# Patient Record
Sex: Female | Born: 1970 | Race: Black or African American | Hispanic: No | Marital: Single | Smoking: Never smoker
Health system: Southern US, Community
[De-identification: ages and names within clinical notes are randomized; demographics above are authoritative.]

## PROBLEM LIST (undated history)

## (undated) DIAGNOSIS — M1990 Unspecified osteoarthritis, unspecified site: Secondary | ICD-10-CM

## (undated) DIAGNOSIS — D259 Leiomyoma of uterus, unspecified: Secondary | ICD-10-CM

## (undated) DIAGNOSIS — D649 Anemia, unspecified: Secondary | ICD-10-CM

## (undated) DIAGNOSIS — I10 Essential (primary) hypertension: Secondary | ICD-10-CM

## (undated) DIAGNOSIS — N939 Abnormal uterine and vaginal bleeding, unspecified: Secondary | ICD-10-CM

## (undated) HISTORY — DX: Essential (primary) hypertension: I10

## (undated) HISTORY — PX: TUBAL LIGATION: SHX77

---

## 1998-02-04 HISTORY — PX: TUBAL LIGATION: SHX77

## 2015-05-25 ENCOUNTER — Ambulatory Visit (INDEPENDENT_AMBULATORY_CARE_PROVIDER_SITE_OTHER): Payer: Federal, State, Local not specified - PPO | Admitting: Physician Assistant

## 2015-05-25 ENCOUNTER — Ambulatory Visit (HOSPITAL_COMMUNITY)
Admission: RE | Admit: 2015-05-25 | Discharge: 2015-05-25 | Disposition: A | Discharge status: Home / Self Care | Payer: Federal, State, Local not specified - PPO | Source: Ambulatory Visit | Attending: Physician Assistant | Admitting: Physician Assistant

## 2015-05-25 ENCOUNTER — Ambulatory Visit (INDEPENDENT_AMBULATORY_CARE_PROVIDER_SITE_OTHER): Payer: Federal, State, Local not specified - PPO

## 2015-05-25 ENCOUNTER — Other Ambulatory Visit: Payer: Self-pay | Admitting: Physician Assistant

## 2015-05-25 VITALS — BP 162/100 | HR 76 | Temp 98.5°F | Ht 70.0 in | Wt 329.0 lb

## 2015-05-25 DIAGNOSIS — R030 Elevated blood-pressure reading, without diagnosis of hypertension: Secondary | ICD-10-CM | POA: Insufficient documentation

## 2015-05-25 DIAGNOSIS — R0609 Other forms of dyspnea: Secondary | ICD-10-CM

## 2015-05-25 DIAGNOSIS — R51 Headache: Secondary | ICD-10-CM

## 2015-05-25 DIAGNOSIS — R42 Dizziness and giddiness: Secondary | ICD-10-CM | POA: Insufficient documentation

## 2015-05-25 DIAGNOSIS — IMO0001 Reserved for inherently not codable concepts without codable children: Secondary | ICD-10-CM

## 2015-05-25 DIAGNOSIS — R519 Headache, unspecified: Secondary | ICD-10-CM

## 2015-05-25 DIAGNOSIS — R0600 Dyspnea, unspecified: Secondary | ICD-10-CM

## 2015-05-25 LAB — COMPLETE METABOLIC PANEL WITH GFR
ALBUMIN: 3.7 g/dL (ref 3.6–5.1)
ALT: 10 U/L (ref 6–29)
AST: 16 U/L (ref 10–35)
Alkaline Phosphatase: 93 U/L (ref 33–115)
BUN: 9 mg/dL (ref 7–25)
CALCIUM: 8.8 mg/dL (ref 8.6–10.2)
CO2: 25 mmol/L (ref 20–31)
CREATININE: 0.59 mg/dL (ref 0.50–1.10)
Chloride: 101 mmol/L (ref 98–110)
GFR, Est Non African American: >89 mL/min (ref >=60)
Glucose, Bld: 81 mg/dL (ref 65–99)
POTASSIUM: 3.9 mmol/L (ref 3.5–5.3)
Sodium: 135 mmol/L (ref 135–146)
Total Bilirubin: 0.3 mg/dL (ref 0.2–1.2)
Total Protein: 7.8 g/dL (ref 6.1–8.1)

## 2015-05-25 LAB — POCT CBC
GRANULOCYTE PERCENT: 67.7 % (ref 37–80)
HEMATOCRIT: 30.7 % — AB (ref 37.7–47.9)
Hemoglobin: 10.3 g/dL — AB (ref 12.2–16.2)
LYMPH, POC: 3 (ref 0.6–3.4)
MCH: 27.9 pg (ref 27–31.2)
MCHC: 33.6 g/dL (ref 31.8–35.4)
MCV: 83.1 fL (ref 80–97)
MID (cbc): 0.8 (ref 0–0.9)
MPV: 7 fL (ref 0–99.8)
POC GRANULOCYTE: 7.9 — AB (ref 2–6.9)
POC LYMPH PERCENT: 25.8 %L (ref 10–50)
POC MID %: 6.5 % (ref 0–12)
Platelet Count, POC: 395 10*3/uL (ref 142–424)
RBC: 3.69 M/uL — AB (ref 4.04–5.48)
RDW, POC: 14.8 %
WBC: 11.7 10*3/uL — AB (ref 4.6–10.2)

## 2015-05-25 LAB — POCT URINALYSIS DIP (MANUAL ENTRY)
BILIRUBIN UA: NEGATIVE
BILIRUBIN UA: NEGATIVE
Blood, UA: NEGATIVE
Glucose, UA: NEGATIVE
LEUKOCYTES UA: NEGATIVE
Nitrite, UA: NEGATIVE
PH UA: 6.5
Protein Ur, POC: NEGATIVE
SPEC GRAV UA: 1.01
Urobilinogen, UA: 0.2

## 2015-05-25 LAB — GLUCOSE, POCT (MANUAL RESULT ENTRY): POC Glucose: 88 mg/dl (ref 70–99)

## 2015-05-25 MED ORDER — IBUPROFEN 600 MG PO TABS: 600.0000 mg | ORAL_TABLET | Freq: Three times a day (TID) | ORAL | Status: DC | PRN

## 2015-05-25 MED ORDER — MECLIZINE HCL 25 MG PO TABS: 25.0000 mg | ORAL_TABLET | Freq: Three times a day (TID) | ORAL | Status: DC | PRN

## 2015-05-25 MED ORDER — LISINOPRIL-HYDROCHLOROTHIAZIDE 20-25 MG PO TABS: 0.5000 | ORAL_TABLET | Freq: Every day | ORAL | Status: DC

## 2015-05-25 NOTE — Progress Notes (Deleted)
   05/25/2015 12:27 PM   DOB: 04-20-1970 / MRN: [id number]  SUBJECTIVE:  Bethany Conley is a 45 y.o. female presenting for   She has no allergies on file.   She  has no past medical history on file.    She   She  has no sexual activity history on file. The patient  has no past surgical history on file.  Her family history is not on file.  ROS  Problem list and medications reviewed and updated by myself where necessary, and exist elsewhere in the encounter.   OBJECTIVE:  There were no vitals taken for this visit.  Physical Exam  No results found for this or any previous visit (from the past 72 hour(s)).  No results found.  ASSESSMENT AND PLAN  There are no diagnoses linked to this encounter.  The patient was advised to call or return to clinic if she does not see an improvement in symptoms or to seek the care of the closest emergency department if she worsens with the above plan.   Deliah BostonMichael Clark, MHS, PA-C [facility] 05/25/2015 12:27 PM

## 2015-05-25 NOTE — Progress Notes (Signed)
05/26/2015 8:34 AM   DOB: 1970-03-07 / MRN: [id number]  SUBJECTIVE:  Bethany Conley is a 45 y.o. female presenting for dizziness and HA.  Reports the dizziness is a room spinning type dizziness and may last up to thirty minutes before subsiding. Associates neck pain that is worse with head movement.  She has tried Excedrin with good relief of both dizziness and HA.   She is hypertensive today and has never had this problem before. Also complains of some new DOE with stair climbing. She denies chest pain, arm pain, diaphoresis.  She is a never smoker.  Lipids have never been evaluated.   She does report a history of vertigo and was treated with meclizine by her provider in [place].  Reports that her dizziness is similar to that today but also different.  She does complain that the dizziness if worse with head movement.       She is allergic to sulfa antibiotics.  She  has no past medical history on file.    She  reports that she has never smoked. She has never used smokeless tobacco. She  has no sexual activity history on file. The patient  has no past surgical history on file.  Her family history is not on file.  Review of Systems  Constitutional: Negative for fever and chills.  Respiratory: Negative for cough.   Cardiovascular: Negative for chest pain.  Gastrointestinal: Negative for nausea, vomiting and abdominal pain.  Skin: Negative for rash.  Neurological: Negative for dizziness and headaches.    Problem list and medications reviewed and updated by myself where necessary, and exist elsewhere in the encounter.   OBJECTIVE:  BP 162/100 mmHg  Pulse 76  Temp(Src) 98.5 F (36.9 C) (Oral)  Ht 5' 10"  (1.778 m)  Wt 329 lb (149.233 kg)  BMI 47.21 kg/m2  SpO2 98%  LMP 05/12/2015  Physical Exam  Constitutional: She is oriented to person, place, and time. She appears well-nourished. No distress.  Eyes: EOM are normal. Pupils are equal, round, and reactive to light.    Cardiovascular: Normal rate, regular rhythm, normal heart sounds and intact distal pulses.   Pulmonary/Chest: Effort normal and breath sounds normal.  Abdominal: She exhibits no distension.  Neurological: She is alert and oriented to person, place, and time. No cranial nerve deficit. Gait normal.  Skin: Skin is dry. She is not diaphoretic.  Psychiatric: She has a normal mood and affect.  Vitals reviewed.   Results for orders placed or performed in visit on 05/25/15 (from the past 72 hour(s))  POCT CBC     Status: Abnormal   Collection Time: 05/25/15  1:33 PM  Result Value Ref Range   WBC 11.7 (A) 4.6 - 10.2 K/uL   Lymph, poc 3.0 0.6 - 3.4   POC LYMPH PERCENT 25.8 10 - 50 %L   MID (cbc) 0.8 0 - 0.9   POC MID % 6.5 0 - 12 %M   POC Granulocyte 7.9 (A) 2 - 6.9   Granulocyte percent 67.7 37 - 80 %G   RBC 3.69 (A) 4.04 - 5.48 M/uL   Hemoglobin 10.3 (A) 12.2 - 16.2 g/dL   HCT, POC 30.7 (A) 37.7 - 47.9 %   MCV 83.1 80 - 97 fL   MCH, POC 27.9 27 - 31.2 pg   MCHC 33.6 31.8 - 35.4 g/dL   RDW, POC 14.8 %   Platelet Count, POC 395 142 - 424 K/uL   MPV 7.0 0 - 99.8  fL  POCT urinalysis dipstick     Status: Abnormal   Collection Time: 05/25/15  1:34 PM  Result Value Ref Range   Color, UA yellow yellow   Clarity, UA cloudy (A) clear   Glucose, UA negative negative   Bilirubin, UA negative negative   Ketones, POC UA negative negative   Spec Grav, UA 1.010    Blood, UA negative negative   pH, UA 6.5    Protein Ur, POC negative negative   Urobilinogen, UA 0.2    Nitrite, UA Negative Negative   Leukocytes, UA Negative Negative  COMPLETE METABOLIC PANEL WITH GFR     Status: None   Collection Time: 05/25/15  2:24 PM  Result Value Ref Range   Sodium 135 135 - 146 mmol/L   Potassium 3.9 3.5 - 5.3 mmol/L   Chloride 101 98 - 110 mmol/L   CO2 25 20 - 31 mmol/L   Glucose, Bld 81 65 - 99 mg/dL   BUN 9 7 - 25 mg/dL   Creat 0.59 0.50 - 1.10 mg/dL   Total Bilirubin 0.3 0.2 - 1.2 mg/dL    Alkaline Phosphatase 93 33 - 115 U/L   AST 16 10 - 35 U/L   ALT 10 6 - 29 U/L   Total Protein 7.8 6.1 - 8.1 g/dL   Albumin 3.7 3.6 - 5.1 g/dL   Calcium 8.8 8.6 - 10.2 mg/dL   GFR, Est African American >89 >=60 mL/min   GFR, Est Non African American >89 >=60 mL/min    Comment:   The estimated GFR is a calculation valid for adults (>=52 years old) that uses the CKD-EPI algorithm to adjust for age and sex. It is   not to be used for children, pregnant women, hospitalized patients,    patients on dialysis, or with rapidly changing kidney function. According to the NKDEP, eGFR >89 is normal, 60-89 shows mild impairment, 30-59 shows moderate impairment, 15-29 shows severe impairment and <15 is ESRD.     POCT glucose (manual entry)     Status: None   Collection Time: 05/25/15  2:28 PM  Result Value Ref Range   POC Glucose 88 70 - 99 mg/dl    Dg Chest 2 View  05/25/2015  CLINICAL DATA:  Dizziness, headache and elevated blood pressure. EXAM: CHEST  2 VIEW COMPARISON:  None. FINDINGS: The heart size and mediastinal contours are within normal limits. Both lungs are clear. The visualized skeletal structures are unremarkable. IMPRESSION: Normal chest x-ray. Electronically Signed   By: Marijo Sanes M.D.   On: 05/25/2015 13:50   Ct Head Wo Contrast  05/25/2015  CLINICAL DATA:  Dizziness and headaches over the past few weeks. EXAM: CT HEAD WITHOUT CONTRAST TECHNIQUE: Contiguous axial images were obtained from the base of the skull through the vertex without intravenous contrast. COMPARISON:  None. FINDINGS: The ventricles are normal in size and configuration. No extra-axial fluid collections are identified. The gray-white differentiation is normal. No CT findings for acute intracranial process such as hemorrhage or infarction. No mass lesions. The brainstem and cerebellum are grossly normal. Benign appearing bilateral basilar artery calcifications. The bony structures are intact. The paranasal sinuses  and mastoid air cells are clear. The globes are intact. IMPRESSION: Normal head CT. Electronically Signed   By: Marijo Sanes M.D.   On: 05/25/2015 15:15    ASSESSMENT AND PLAN  Bethany Conley was seen today for dizziness, headache, elevated bp at triage and neck pain.  Diagnoses and all orders for  this visit:  Acute nonintractable headache, unspecified headache type: Her EKG is normal. Her pressure is not quite high enough to cause HA and dizziness, however she has been taking Exedrine to relieve the HA thus making an intracranial process possible.  Given her leukocytosis she may have an ABRS.  Will get a head CT to evaluate for intracranial process and to rule in or out ABRS.  Will treat per head ct and will plan to start her on HCTZ-Lisinopril today either way.   Elevated BP: See problem one.  -     EKG 12-Lead -     POCT CBC -     POCT urinalysis dipstick -     DG Chest 2 View; Future -     CT Head Wo Contrast; Future  DOE: Again, her EKG is normal and she has a normal chest rad.  She denies new leg swelling and chest pain.  Will get her into cardiology next week. Will evaluate lipids to calculate ASCVD score.  CMET negative for elevated blood glucose.      The patient was advised to call or return to clinic if she does not see an improvement in symptoms or to seek the care of the closest emergency department if she worsens with the above plan.   Philis Fendt, MHS, PA-C [facility] 05/26/2015 8:34 AM

## 2015-05-25 NOTE — Patient Instructions (Addendum)
[facility] can go straight to [name] to [facility]. You will go to Radiology on the first floor and register there.      IF you received an x-ray today, you will receive an invoice from [facility]. Please contact [facility] at [phone] with questions or concerns regarding your invoice.   IF you received labwork today, you will receive an invoice from [facility]. Please contact [facility] at [phone] with questions or concerns regarding your invoice.   Our billing staff will not be able to assist you with questions regarding bills from these companies.  You will be contacted with the lab results as soon as they are available. The fastest way to get your results is to activate your My Chart account. Instructions are located on the last page of this paperwork. If you have not heard from us regarding the results in 2 weeks, please contact this office.

## 2015-05-29 LAB — LIPID PANEL
CHOL/HDL RATIO: 2.9 ratio (ref <=5.0)
CHOLESTEROL: 199 mg/dL (ref 125–200)
HDL: 69 mg/dL (ref >=46)
LDL CALC: 117 mg/dL (ref <130)
TRIGLYCERIDES: 63 mg/dL (ref <150)
VLDL: 13 mg/dL (ref <30)

## 2015-08-30 ENCOUNTER — Other Ambulatory Visit: Payer: Self-pay | Admitting: Family Medicine

## 2015-08-30 DIAGNOSIS — Z1231 Encounter for screening mammogram for malignant neoplasm of breast: Secondary | ICD-10-CM

## 2015-09-06 ENCOUNTER — Ambulatory Visit: Payer: Federal, State, Local not specified - PPO

## 2015-09-13 ENCOUNTER — Ambulatory Visit
Admission: RE | Admit: 2015-09-13 | Discharge: 2015-09-13 | Disposition: A | Discharge status: Home / Self Care | Payer: Federal, State, Local not specified - PPO | Source: Ambulatory Visit | Attending: Family Medicine | Admitting: Family Medicine

## 2015-09-13 ENCOUNTER — Ambulatory Visit: Payer: Federal, State, Local not specified - PPO

## 2015-09-13 DIAGNOSIS — Z1231 Encounter for screening mammogram for malignant neoplasm of breast: Secondary | ICD-10-CM

## 2016-03-02 ENCOUNTER — Ambulatory Visit (HOSPITAL_COMMUNITY)
Admission: EM | Admit: 2016-03-02 | Discharge: 2016-03-02 | Disposition: A | Discharge status: Home / Self Care | Payer: 59 | Attending: Family Medicine | Admitting: Family Medicine

## 2016-03-02 ENCOUNTER — Ambulatory Visit (INDEPENDENT_AMBULATORY_CARE_PROVIDER_SITE_OTHER): Payer: 59

## 2016-03-02 ENCOUNTER — Encounter (HOSPITAL_COMMUNITY): Payer: Self-pay | Admitting: Family Medicine

## 2016-03-02 DIAGNOSIS — M1712 Unilateral primary osteoarthritis, left knee: Secondary | ICD-10-CM | POA: Diagnosis not present

## 2016-03-02 MED ORDER — KETOROLAC TROMETHAMINE 60 MG/2ML IM SOLN
INTRAMUSCULAR | Status: AC
  Filled 2016-03-02: qty 2

## 2016-03-02 MED ORDER — KETOROLAC TROMETHAMINE 60 MG/2ML IM SOLN
60.0000 mg | Freq: Once | INTRAMUSCULAR | Status: AC
  Administered 2016-03-02: 60 mg via INTRAMUSCULAR

## 2016-03-02 MED ORDER — PREDNISONE 10 MG PO TABS: 20.0000 mg | ORAL_TABLET | Freq: Every day | ORAL | 0 refills | Status: AC

## 2016-03-02 MED ORDER — IBUPROFEN 800 MG PO TABS: 800.0000 mg | ORAL_TABLET | Freq: Three times a day (TID) | ORAL | 0 refills | Status: AC

## 2016-03-02 NOTE — ED Triage Notes (Signed)
Pt here for left knee pain, redness and swelling. sts has been using ice and motrin without relief.denies injury.

## 2016-03-02 NOTE — ED Provider Notes (Signed)
CSN: [id number]     Arrival date & time 03/02/16  1928 History   First MD Initiated Contact with Patient 03/02/16 2059     Chief Complaint  Patient presents with  . Knee Pain   (Consider location/radiation/quality/duration/timing/severity/associated sxs/prior Treatment) Cannot bear weight on the left leg.    The history is provided by the patient.  Knee Pain  Location:  Knee Time since incident:  2 days Injury: no   Knee location:  L knee Pain details:    Quality:  Throbbing and shooting   Radiates to:  Does not radiate   Pain severity now: 10/10.   Onset quality:  Gradual   Duration:  2 days   Timing:  Constant   Progression:  Worsening Chronicity:  New Dislocation: no   Prior injury to area:  No Worsened by:  Nothing Ineffective treatments:  NSAIDs and acetaminophen Associated symptoms: stiffness and swelling   Associated symptoms: no back pain, no decreased ROM, no fever, no numbness and no tingling   Risk factors: no recent illness     History reviewed. No pertinent past medical history. History reviewed. No pertinent surgical history. History reviewed. No pertinent family history. Social History  Substance Use Topics  . Smoking status: Never Smoker  . Smokeless tobacco: Never Used  . Alcohol use Not on file   OB History    No data available     Review of Systems  Constitutional: Negative for fever.       As stated in the HPI  Musculoskeletal: Positive for stiffness. Negative for back pain.    Allergies  Sulfa antibiotics  Home Medications   Prior to Admission medications   Medication Sig Start Date End Date Taking? Authorizing Provider  ibuprofen (ADVIL,MOTRIN) 800 MG tablet Take 1 tablet (800 mg total) by mouth 3 (three) times daily. 03/02/16 03/07/16  Lucia Estelle, NP  lisinopril-hydrochlorothiazide (PRINZIDE,ZESTORETIC) 20-25 MG tablet Take 0.5 tablets by mouth daily. 05/25/15   Ofilia Neas, PA-C  meclizine (ANTIVERT) 25 MG tablet Take 1 tablet (25  mg total) by mouth 3 (three) times daily as needed for dizziness. 05/25/15   Ofilia Neas, PA-C  predniSONE (DELTASONE) 10 MG tablet Take 2 tablets (20 mg total) by mouth daily. Take 60 mg on day 1-2, take 40 mg on day 3-4, take 20 mg on day 5-6. 03/02/16 03/14/16  Lucia Estelle, NP   Meds Ordered and Administered this Visit   Medications  ketorolac (TORADOL) injection 60 mg (not administered)    BP 162/94   Pulse 90   Temp 98.5 F (36.9 C)   Resp 18   SpO2 100%  No data found.   Physical Exam  Constitutional: She is oriented to person, place, and time. She appears well-developed and well-nourished.  HENT:  Head: Normocephalic.  Cardiovascular: Normal rate.   Pulmonary/Chest: Effort normal.  Musculoskeletal:  Left knee has swelling and redness. No pain on palpation. Has full ROM.  Crepitus present. Patient in wheelchair.   Right knee unremarkable.   Neurological: She is alert and oriented to person, place, and time.    Urgent Care Course     Procedures (including critical care time)  Labs Review Labs Reviewed - No data to display  Imaging Review Dg Knee Complete 4 Views Left  Result Date: 03/02/2016 CLINICAL DATA:  46 year old female with left knee pain and swelling. No trauma. EXAM: LEFT KNEE - COMPLETE 4+ VIEW COMPARISON:  None. FINDINGS: There is no acute fracture or dislocation. There  is mild osteoarthritic changes with mild narrowing of the medial compartment and bone spurring. There is a small suprapatellar effusion. The soft tissues appear unremarkable. IMPRESSION: No acute fracture or dislocation. Mild osteoarthritic changes. Small suprapatellar effusion. Electronically Signed   By: Elgie CollardArash  Radparvar M.D.   On: 03/02/2016 21:17    MDM   1. Primary osteoarthritis of left knee    1) Crepitus present on exam. Xray reveals mild osteoarthritic changes 2) Start ibuprofen PRN for pain relief 3) Start prednisone 6-day taper 4) Toradol 60 mg IM given 5) F/u with PCP  with further management.  6) All questions answered.    Lucia EstelleFeng Evaan Tidwell, NP 03/02/16 2129

## 2016-04-04 ENCOUNTER — Telehealth: Payer: Self-pay | Admitting: Emergency Medicine

## 2016-04-04 NOTE — Telephone Encounter (Signed)
Patient called to return call. Phone was disconnected. Please call patient back.

## 2016-04-04 NOTE — Telephone Encounter (Signed)
Called and left voicemail for pt to return call to office.  

## 2016-04-04 NOTE — Telephone Encounter (Signed)
Pre visit attempted. Left voicemail for pt to return call to office.  

## 2016-04-10 ENCOUNTER — Encounter: Payer: Self-pay | Admitting: Family Medicine

## 2016-04-10 ENCOUNTER — Ambulatory Visit (INDEPENDENT_AMBULATORY_CARE_PROVIDER_SITE_OTHER): Payer: 59 | Admitting: Family Medicine

## 2016-04-10 VITALS — BP 124/82 | HR 87 | Temp 98.3°F | Ht 69.5 in | Wt 345.4 lb

## 2016-04-10 DIAGNOSIS — D500 Iron deficiency anemia secondary to blood loss (chronic): Secondary | ICD-10-CM | POA: Diagnosis not present

## 2016-04-10 DIAGNOSIS — N920 Excessive and frequent menstruation with regular cycle: Secondary | ICD-10-CM | POA: Diagnosis not present

## 2016-04-10 DIAGNOSIS — M25562 Pain in left knee: Secondary | ICD-10-CM | POA: Insufficient documentation

## 2016-04-10 DIAGNOSIS — R600 Localized edema: Secondary | ICD-10-CM

## 2016-04-10 DIAGNOSIS — M25572 Pain in left ankle and joints of left foot: Secondary | ICD-10-CM | POA: Insufficient documentation

## 2016-04-10 DIAGNOSIS — I10 Essential (primary) hypertension: Secondary | ICD-10-CM | POA: Diagnosis not present

## 2016-04-10 LAB — LIPID PANEL
Cholesterol: 179 mg/dL (ref 0–200)
HDL: 53.6 mg/dL (ref >39.00)
LDL Cholesterol: 111 mg/dL — ABNORMAL HIGH (ref 0–99)
NonHDL: 125.64
Total CHOL/HDL Ratio: 3
Triglycerides: 72 mg/dL (ref 0.0–149.0)
VLDL: 14.4 mg/dL (ref 0.0–40.0)

## 2016-04-10 LAB — CBC WITH DIFFERENTIAL/PLATELET
Basophils Absolute: 0.1 10*3/uL (ref 0.0–0.1)
Basophils Relative: 0.9 % (ref 0.0–3.0)
Eosinophils Absolute: 0.2 10*3/uL (ref 0.0–0.7)
Eosinophils Relative: 2.3 % (ref 0.0–5.0)
HCT: 32.7 % — ABNORMAL LOW (ref 36.0–46.0)
Hemoglobin: 10.7 g/dL — ABNORMAL LOW (ref 12.0–15.0)
Lymphocytes Relative: 22 % (ref 12.0–46.0)
Lymphs Abs: 1.9 10*3/uL (ref 0.7–4.0)
MCHC: 32.7 g/dL (ref 30.0–36.0)
MCV: 90.7 fl (ref 78.0–100.0)
Monocytes Absolute: 0.8 10*3/uL (ref 0.1–1.0)
Monocytes Relative: 9.7 % (ref 3.0–12.0)
Neutro Abs: 5.7 10*3/uL (ref 1.4–7.7)
Neutrophils Relative %: 65.1 % (ref 43.0–77.0)
Platelets: 399 10*3/uL (ref 150.0–400.0)
RBC: 3.61 Mil/uL — ABNORMAL LOW (ref 3.87–5.11)
RDW: 14 % (ref 11.5–15.5)
WBC: 8.8 10*3/uL (ref 4.0–10.5)

## 2016-04-10 LAB — COMPREHENSIVE METABOLIC PANEL
ALT: 11 U/L (ref 0–35)
AST: 16 U/L (ref 0–37)
Albumin: 3.9 g/dL (ref 3.5–5.2)
Alkaline Phosphatase: 101 U/L (ref 39–117)
BUN: 13 mg/dL (ref 6–23)
CO2: 30 mEq/L (ref 19–32)
Calcium: 9.3 mg/dL (ref 8.4–10.5)
Chloride: 105 mEq/L (ref 96–112)
Creatinine, Ser: 0.8 mg/dL (ref 0.40–1.20)
GFR: 98.92 mL/min (ref >60.00)
Glucose, Bld: 75 mg/dL (ref 70–99)
Potassium: 4.3 mEq/L (ref 3.5–5.1)
Sodium: 139 mEq/L (ref 135–145)
Total Bilirubin: 0.3 mg/dL (ref 0.2–1.2)
Total Protein: 7.8 g/dL (ref 6.0–8.3)

## 2016-04-10 LAB — HEMOGLOBIN A1C: Hgb A1c MFr Bld: 5.1 % (ref 4.6–6.5)

## 2016-04-10 LAB — TSH: TSH: 1 u[IU]/mL (ref 0.35–4.50)

## 2016-04-10 LAB — T4, FREE: Free T4: 0.8 ng/dL (ref 0.60–1.60)

## 2016-04-10 MED ORDER — BLOOD PRESSURE KIT: PACK | 0 refills | Status: DC

## 2016-04-10 NOTE — Progress Notes (Signed)
Bethany Conley is a 46 y.o. female is here to [place].   History of Present Illness:   1. Leg edema, left. Chronic but worsening. Patient stands at work daily - [employer]. Hx of left knee arthralgia, followed by [facility]. No redness.    2. Morbid obesity ([place]). Weight gain due to inactivity, chronic pain. She has been working hard on weight loss - eating lower carbohydrate diet, exercises with her son 2-3 days per week at her apartment gym - elliptical and bike.    3. Arthralgia of left knee. Followed by [facility]. Upcoming knee injection.      4. Arthralgia of left ankle. Worse at the end of the day, when she has to stand for long periods of time.    5. Iron deficiency anemia due to chronic blood loss. Due to heavy menses, taking iron supplement. No new CP, SOB, fatigue.   6. Menorrhagia with regular cycle.Followed by GYN.   7. Essential hypertension. Hx of HTN. Previously on Lisinopril/HCTZ. Off medication x 1 month. Cardiovascular ROS: negative for - chest pain, dyspnea on exertion, palpitations or rapid heart rate.    There are no preventive care reminders to display for this patient.  PMHx, SurgHx, SocialHx, Medications, and Allergies were reviewed in the Visit Navigator and updated as appropriate.   Social History  Substance Use Topics  . Smoking status: Never Smoker  . Smokeless tobacco: Never Used  . Alcohol use Not on file   Current Medications and Allergies:   .  diclofenac (VOLTAREN) 75 MG EC tablet, , Disp: , Rfl:  .  docusate sodium (COLACE) 100 MG capsule, Take 100 mg by mouth 2 (two) times daily., Disp: , Rfl: 2 .  ferrous sulfate 325 (65 FE) MG tablet, Take 325 mg by mouth 2 (two) times daily., Disp: , Rfl: 4 .  tranexamic acid (LYSTEDA) 650 MG TABS tablet, , Disp: , Rfl:    Allergies  Allergen Reactions  . Sulfa Antibiotics Rash   Review of Systems:   Review of Systems: The patient denies chills, fever, weight loss/gain, fatigue,  lack of appetite, difficulty swallowing, sore throat, earache, post-nasal drip, chest pain, palpitations, cough, dyspnea, wheezing, change in bowel habits, nausea, vomiting, changes in urination, skin changes, dizziness, headaches, numbness, changes in balance or coordination, anxiety, depression, memory changes, swollen glands, easy bruising.   Vitals:   Vitals:   04/10/16 0821  BP: 124/82  Pulse: 87  Temp: 98.3 F (36.8 C)  TempSrc: Oral  SpO2: 98%  Weight: (!) 345 lb 6.4 oz (156.7 kg)  Height: 5' 9.5" (1.765 m)     Body mass index is 50.28 kg/m.   Physical Exam:   General: Alert, cooperative, appears stated age and no distress.  HEENT:  Normocephalic, without obvious abnormality, atraumatic. Conjunctivae/corneas clear. PERRL, EOM's intact. Normal TM's and external ear canals both ears. Nares normal. Septum midline. Mucosa normal. No drainage or sinus tenderness. Lips, mucosa, and tongue normal; teeth and gums normal.  Lungs: Clear to auscultation bilaterally.  Heart:: Regular rate and rhythm, S1, S2 normal, no murmur, click, rub or gallop.  Abdomen: Soft, non-tender; bowel sounds normal; no masses,  no organomegaly.  Extremities: Extremities normal, atraumatic, no cyanosis or edema.  Pulses: 2+ and symmetric.  Skin: Skin color, texture, turgor normal. No rashes or lesions.  Neurologic: Alert and oriented X 3, normal strength and tone. Normal symmetric. reflexes. Normal coordination and gait.  Psych: Alert,oriented, in NAD with a full range of  affect, normal behavior and no psychotic features  Msk:  Left knee with obvious OA changes, + crepitus with ROM, ttp joint lines.   Results for orders placed or performed in visit on 04/10/16  Lipid panel  Result Value Ref Range   Cholesterol 179 0 - 200 mg/dL   Triglycerides 72.0 0.0 - 149.0 mg/dL   HDL 53.60 >39.00 mg/dL   VLDL 14.4 0.0 - 40.0 mg/dL   LDL Cholesterol 111 (H) 0 - 99 mg/dL   Total CHOL/HDL Ratio 3    NonHDL 125.64     Comprehensive metabolic panel  Result Value Ref Range   Sodium 139 135 - 145 mEq/L   Potassium 4.3 3.5 - 5.1 mEq/L   Chloride 105 96 - 112 mEq/L   CO2 30 19 - 32 mEq/L   Glucose, Bld 75 70 - 99 mg/dL   BUN 13 6 - 23 mg/dL   Creatinine, Ser 0.80 0.40 - 1.20 mg/dL   Total Bilirubin 0.3 0.2 - 1.2 mg/dL   Alkaline Phosphatase 101 39 - 117 U/L   AST 16 0 - 37 U/L   ALT 11 0 - 35 U/L   Total Protein 7.8 6.0 - 8.3 g/dL   Albumin 3.9 3.5 - 5.2 g/dL   Calcium 9.3 8.4 - 10.5 mg/dL   GFR 98.92 >60.00 mL/min  CBC with Differential/Platelet  Result Value Ref Range   WBC 8.8 4.0 - 10.5 K/uL   RBC 3.61 (L) 3.87 - 5.11 Mil/uL   Hemoglobin 10.7 (L) 12.0 - 15.0 g/dL   HCT 32.7 (L) 36.0 - 46.0 %   MCV 90.7 78.0 - 100.0 fl   MCHC 32.7 30.0 - 36.0 g/dL   RDW 14.0 11.5 - 15.5 %   Platelets 399.0 150.0 - 400.0 K/uL   Neutrophils Relative % 65.1 43.0 - 77.0 %   Lymphocytes Relative 22.0 12.0 - 46.0 %   Monocytes Relative 9.7 3.0 - 12.0 %   Eosinophils Relative 2.3 0.0 - 5.0 %   Basophils Relative 0.9 0.0 - 3.0 %   Neutro Abs 5.7 1.4 - 7.7 K/uL   Lymphs Abs 1.9 0.7 - 4.0 K/uL   Monocytes Absolute 0.8 0.1 - 1.0 K/uL   Eosinophils Absolute 0.2 0.0 - 0.7 K/uL   Basophils Absolute 0.1 0.0 - 0.1 K/uL  Hemoglobin A1c  Result Value Ref Range   Hgb A1c MFr Bld 5.1 4.6 - 6.5 %  TSH  Result Value Ref Range   TSH 1.00 0.35 - 4.50 uIU/mL  T4, free  Result Value Ref Range   Free T4 0.80 0.60 - 1.60 ng/dL      Assessment and Plan:   [name] was seen today for hypertension and leg swelling.  Diagnoses and all orders for this visit:  Leg edema, left Comments: None today. Recommended compression stockings and ankle brace. Orders: -     EKG 12-Lead  Morbid obesity ([place]) Comments: Lab ordered. Orders: -     Lipid panel -     Comprehensive metabolic panel -     CBC with Differential/Platelet -     Hemoglobin A1c -     TSH -     T4, free -     Insulin, Free (Bioactive)  Arthralgia of left  knee Comments: Followed by [facility]. Upcoming injections.   Arthralgia of left ankle Comments: Recommend wearing brace during the day. Okay to write letter to provide chair prn at work.  Iron deficiency anemia due to chronic blood loss Menorrhagia  with regular cycle Comments: Followed by GYN. On iron supplements. Recommend review option to decrease menses with GYN.  Essential hypertension Comments: Normotensive today. She will monitor while working on weight loss. Orders: -     Blood Pressure KIT; To check BP daily.  . Reviewed expectations re: course of current medical issues. . Discussed self-management of symptoms. . Outlined signs and symptoms indicating need for more acute intervention. . Patient verbalized understanding and all questions were answered. . See orders for this visit as documented in the electronic medical record. . Patient received an After Visit Summary.  Records requested if needed. I spent 45 minutes with this patient, greater than 50% was face-to-face time counseling regarding the above diagnoses.  Briscoe Deutscher, [place], [place] 04/10/2016   Follow-up: Return in about 1 week (around 04/17/2016).  Meds ordered this encounter  Medications  . ferrous sulfate 325 (65 FE) MG tablet    Sig: Take 325 mg by mouth 2 (two) times daily.    Refill:  4  . docusate sodium (COLACE) 100 MG capsule    Sig: Take 100 mg by mouth 2 (two) times daily.    Refill:  2  . diclofenac (VOLTAREN) 75 MG EC tablet  . tranexamic acid (LYSTEDA) 650 MG TABS tablet  . Blood Pressure KIT    Sig: To check BP daily.    Dispense:  1 each    Refill:  0   Medications Discontinued During This Encounter  Medication Reason  . meclizine (ANTIVERT) 25 MG tablet Error  . lisinopril-hydrochlorothiazide (PRINZIDE,ZESTORETIC) 20-25 MG tablet    Orders Placed This Encounter  Procedures  . Lipid panel  . Comprehensive metabolic panel  . CBC with Differential/Platelet   . Hemoglobin A1c  . TSH  . T4, free  . Insulin, Free (Bioactive)  . EKG 12-Lead

## 2016-04-10 NOTE — Patient Instructions (Signed)
It was so nice to meet you today!  Check your blood pressures daily and keep a log.  I will see you next week to go over your labs and discuss weight loss.

## 2016-04-10 NOTE — Progress Notes (Signed)
Pre visit review using our clinic review tool, if applicable. No additional management support is needed unless otherwise documented below in the visit note. 

## 2016-04-11 NOTE — Telephone Encounter (Signed)
Patient was seen in office yesterday 

## 2016-04-14 LAB — INSULIN, FREE (BIOACTIVE): Insulin, Free: 9 u[IU]/mL (ref 1.5–14.9)

## 2016-04-16 ENCOUNTER — Encounter: Payer: Self-pay | Admitting: Surgical

## 2016-04-16 ENCOUNTER — Encounter: Payer: Self-pay | Admitting: Family Medicine

## 2016-04-16 ENCOUNTER — Ambulatory Visit (INDEPENDENT_AMBULATORY_CARE_PROVIDER_SITE_OTHER): Payer: 59 | Admitting: Family Medicine

## 2016-04-16 ENCOUNTER — Ambulatory Visit: Payer: 59 | Admitting: Family Medicine

## 2016-04-16 DIAGNOSIS — D500 Iron deficiency anemia secondary to blood loss (chronic): Secondary | ICD-10-CM

## 2016-04-16 DIAGNOSIS — E88819 Insulin resistance, unspecified: Secondary | ICD-10-CM

## 2016-04-16 DIAGNOSIS — I10 Essential (primary) hypertension: Secondary | ICD-10-CM | POA: Diagnosis not present

## 2016-04-16 DIAGNOSIS — E8881 Metabolic syndrome: Secondary | ICD-10-CM

## 2016-04-16 MED ORDER — PHENTERMINE HCL 15 MG PO CAPS: 15.0000 mg | ORAL_CAPSULE | ORAL | 0 refills | Status: DC

## 2016-04-16 MED ORDER — METFORMIN HCL 500 MG PO TABS: 500.0000 mg | ORAL_TABLET | Freq: Every day | ORAL | 3 refills | Status: DC

## 2016-04-16 NOTE — Patient Instructions (Signed)
Results for orders placed or performed in visit on 04/10/16  Lipid panel  Result Value Ref Range   Cholesterol 179 0 - 200 mg/dL   Triglycerides 16.172.0 0.0 - 149.0 mg/dL   HDL 09.6053.60 >45.40>39.00 mg/dL   VLDL 98.114.4 0.0 - 19.140.0 mg/dL   LDL Cholesterol 478111 (H) 0 - 99 mg/dL   Total CHOL/HDL Ratio 3    NonHDL 125.64   Comprehensive metabolic panel  Result Value Ref Range   Sodium 139 135 - 145 mEq/L   Potassium 4.3 3.5 - 5.1 mEq/L   Chloride 105 96 - 112 mEq/L   CO2 30 19 - 32 mEq/L   Glucose, Bld 75 70 - 99 mg/dL   BUN 13 6 - 23 mg/dL   Creatinine, Ser 2.950.80 0.40 - 1.20 mg/dL   Total Bilirubin 0.3 0.2 - 1.2 mg/dL   Alkaline Phosphatase 101 39 - 117 U/L   AST 16 0 - 37 U/L   ALT 11 0 - 35 U/L   Total Protein 7.8 6.0 - 8.3 g/dL   Albumin 3.9 3.5 - 5.2 g/dL   Calcium 9.3 8.4 - 62.110.5 mg/dL   GFR 30.8698.92 >57.84>60.00 mL/min  CBC with Differential/Platelet  Result Value Ref Range   WBC 8.8 4.0 - 10.5 K/uL   RBC 3.61 (L) 3.87 - 5.11 Mil/uL   Hemoglobin 10.7 (L) 12.0 - 15.0 g/dL   HCT 69.632.7 (L) 29.536.0 - 28.446.0 %   MCV 90.7 78.0 - 100.0 fl   MCHC 32.7 30.0 - 36.0 g/dL   RDW 13.214.0 44.011.5 - 10.215.5 %   Platelets 399.0 150.0 - 400.0 K/uL   Neutrophils Relative % 65.1 43.0 - 77.0 %   Lymphocytes Relative 22.0 12.0 - 46.0 %   Monocytes Relative 9.7 3.0 - 12.0 %   Eosinophils Relative 2.3 0.0 - 5.0 %   Basophils Relative 0.9 0.0 - 3.0 %   Neutro Abs 5.7 1.4 - 7.7 K/uL   Lymphs Abs 1.9 0.7 - 4.0 K/uL   Monocytes Absolute 0.8 0.1 - 1.0 K/uL   Eosinophils Absolute 0.2 0.0 - 0.7 K/uL   Basophils Absolute 0.1 0.0 - 0.1 K/uL  Hemoglobin A1c  Result Value Ref Range   Hgb A1c MFr Bld 5.1 4.6 - 6.5 %  TSH  Result Value Ref Range   TSH 1.00 0.35 - 4.50 uIU/mL  T4, free  Result Value Ref Range   Free T4 0.80 0.60 - 1.60 ng/dL  Insulin, Free (Bioactive)  Result Value Ref Range   Insulin, Free 9.0 1.5 - 14.9 uIU/mL

## 2016-04-16 NOTE — Progress Notes (Signed)
Bethany Conley is a 46 y.o. female is here to discuss:  History of Present Illness:   Chief Complaint  Patient presents with  . Hypertension    Patient has log of BP for the last 5 days, lowest reading 128/87 on 04/14/16. Highest reading 140/95 on 04/11/16. Patient stated that she had symptoms when BP was elevated.  Marland Kitchen. Results    Review lab results.   HPI: 1. Morbid obesity (HCC). Patient is frustrated that she has gained a few pounds since her last visit. She has been exercising and working on picking low carbohydrate meals.     2. Essential hypertension. Home blood pressure readings are generally at goal. Avoiding excessive salt intake. Trying to exercise on a regular basis. Denies chest pain.   Wt Readings from Last 3 Encounters:  04/16/16 (!) 347 lb (157.4 kg)  04/10/16 (!) 345 lb 6.4 oz (156.7 kg)  05/25/15 (!) 329 lb (149.2 kg)    reports that she has never smoked. She has never used smokeless tobacco. BP Readings from Last 3 Encounters:  04/16/16 140/82  04/10/16 124/82  03/02/16 162/94   Lab Results  Component Value Date   CREATININE 0.80 04/10/2016    3. Insulin resistance. Insulin < 5.     4. Iron deficiency anemia due to chronic blood loss. Due to heavy menses. Followed by GYN.    PMHx, SurgHx, SocialHx, FamHx, Medications, and Allergies were reviewed in the Visit Navigator and updated as appropriate.   Patient Active Problem List   Diagnosis Date Noted  . Morbid obesity (HCC) 04/10/2016  . Arthralgia of left knee 04/10/2016  . Arthralgia of left ankle 04/10/2016  . Iron deficiency anemia due to chronic blood loss 04/10/2016  . Menorrhagia with regular cycle 04/10/2016  . Essential hypertension 04/10/2016    Social History  Substance Use Topics  . Smoking status: Never Smoker  . Smokeless tobacco: Never Used  . Alcohol use Not on file    Current Medications and Allergies:   .  diclofenac (VOLTAREN) 75 MG EC tablet, , Disp: , Rfl:  .  docusate  sodium (COLACE) 100 MG capsule, Take 100 mg by mouth 2 (two) times daily., Disp: , Rfl: 2 .  ferrous sulfate 325 (65 FE) MG tablet, Take 325 mg by mouth 2 (two) times daily., Disp: , Rfl: 4 .  tranexamic acid (LYSTEDA) 650 MG TABS tablet, , Disp: , Rfl:    Allergies  Allergen Reactions  . Sulfa Antibiotics Rash    Review of Systems   Review of Systems  Constitutional: Negative for chills and fever.  HENT: Negative for ear pain, hearing loss and sore throat.   Eyes: Negative for blurred vision and double vision.  Respiratory: Negative for cough, shortness of breath and wheezing.   Cardiovascular: Negative for chest pain, palpitations and leg swelling.  Gastrointestinal: Negative for abdominal pain, diarrhea, nausea and vomiting.  Genitourinary: Negative for dysuria.  Musculoskeletal: Positive for joint pain. Negative for back pain and neck pain.  Skin: Negative for rash.  Neurological: Negative for dizziness, weakness and headaches.  Psychiatric/Behavioral: Negative for depression, memory loss, substance abuse and suicidal ideas.    Vitals:   Vitals:   04/16/16 1425  BP: 140/82  Pulse: 80  Temp: 98 F (36.7 C)  TempSrc: Oral  SpO2: 98%  Weight: (!) 347 lb (157.4 kg)  Height: 5' 9.5" (1.765 m)     Body mass index is 50.51 kg/m.   Physical Exam:    Physical  Exam  Constitutional: She appears well-developed and well-nourished. No distress.  HENT:  Head: Normocephalic and atraumatic.  Eyes: Conjunctivae and EOM are normal. Pupils are equal, round, and reactive to light.  Neck: Normal range of motion.  Cardiovascular: Normal rate and regular rhythm.   Pulmonary/Chest: Effort normal.  Neurological: She is alert.  Skin: Skin is warm and dry.  Psychiatric: She has a normal mood and affect. Her behavior is normal. Judgment and thought content normal.   Results for orders placed or performed in visit on 04/10/16  Lipid panel  Result Value Ref Range   Cholesterol 179 0  - 200 mg/dL   Triglycerides 16.1 0.0 - 149.0 mg/dL   HDL 09.60 >45.40 mg/dL   VLDL 98.1 0.0 - 19.1 mg/dL   LDL Cholesterol 478 (H) 0 - 99 mg/dL   Total CHOL/HDL Ratio 3    NonHDL 125.64   Comprehensive metabolic panel  Result Value Ref Range   Sodium 139 135 - 145 mEq/L   Potassium 4.3 3.5 - 5.1 mEq/L   Chloride 105 96 - 112 mEq/L   CO2 30 19 - 32 mEq/L   Glucose, Bld 75 70 - 99 mg/dL   BUN 13 6 - 23 mg/dL   Creatinine, Ser 2.95 0.40 - 1.20 mg/dL   Total Bilirubin 0.3 0.2 - 1.2 mg/dL   Alkaline Phosphatase 101 39 - 117 U/L   AST 16 0 - 37 U/L   ALT 11 0 - 35 U/L   Total Protein 7.8 6.0 - 8.3 g/dL   Albumin 3.9 3.5 - 5.2 g/dL   Calcium 9.3 8.4 - 62.1 mg/dL   GFR 30.86 >57.84 mL/min  CBC with Differential/Platelet  Result Value Ref Range   WBC 8.8 4.0 - 10.5 K/uL   RBC 3.61 (L) 3.87 - 5.11 Mil/uL   Hemoglobin 10.7 (L) 12.0 - 15.0 g/dL   HCT 69.6 (L) 29.5 - 28.4 %   MCV 90.7 78.0 - 100.0 fl   MCHC 32.7 30.0 - 36.0 g/dL   RDW 13.2 44.0 - 10.2 %   Platelets 399.0 150.0 - 400.0 K/uL   Neutrophils Relative % 65.1 43.0 - 77.0 %   Lymphocytes Relative 22.0 12.0 - 46.0 %   Monocytes Relative 9.7 3.0 - 12.0 %   Eosinophils Relative 2.3 0.0 - 5.0 %   Basophils Relative 0.9 0.0 - 3.0 %   Neutro Abs 5.7 1.4 - 7.7 K/uL   Lymphs Abs 1.9 0.7 - 4.0 K/uL   Monocytes Absolute 0.8 0.1 - 1.0 K/uL   Eosinophils Absolute 0.2 0.0 - 0.7 K/uL   Basophils Absolute 0.1 0.0 - 0.1 K/uL  Hemoglobin A1c  Result Value Ref Range   Hgb A1c MFr Bld 5.1 4.6 - 6.5 %  TSH  Result Value Ref Range   TSH 1.00 0.35 - 4.50 uIU/mL  T4, free  Result Value Ref Range   Free T4 0.80 0.60 - 1.60 ng/dL  Insulin, Free (Bioactive)  Result Value Ref Range   Insulin, Free 9.0 1.5 - 14.9 uIU/mL    Assessment and Plan:   Bethany Conley was seen today for hypertension and results.  Diagnoses and all orders for this visit:  Morbid obesity (HCC) Comments: Phentermine as below. Reviewed healthy diet choices and  exercise. Orders: -     phentermine 15 MG capsule; Take 1 capsule (15 mg total) by mouth every morning. -     phentermine 15 MG capsule; Take 1 capsule (15 mg total) by mouth every morning.  Essential hypertension Comments: Home and office BPs generally in good range. No addition of medication today.  Insulin resistance Comments: Insulin > 5. Okay to start Metfromin 500 mg po daily.  Orders: -     metFORMIN (GLUCOPHAGE) 500 MG tablet; Take 1 tablet (500 mg total) by mouth daily with breakfast.  Iron deficiency anemia due to chronic blood loss Comments: Patient will continue iron supplement and discuss menses with GYN.   Marland Kitchen Reviewed expectations re: course of current medical issues. . Discussed self-management of symptoms. . Outlined signs and symptoms indicating need for more acute intervention. . Patient verbalized understanding and all questions were answered. . See orders for this visit as documented in the electronic medical record. . Patient received an After Visit Summary.  [name] acted as Neurosurgeon for Dr. Earlene Plater.  CMA served as Neurosurgeon during this visit. History, Physical, and Plan performed by medical provider. Documentation and orders reviewed and attested to. Helane Rima, D.O.  Helane Rima, D.O. [place] 04/16/2016  Follow-up: Return in about 2 months (around 06/16/2016).

## 2016-04-16 NOTE — Progress Notes (Signed)
Pre visit review using our clinic review tool, if applicable. No additional management support is needed unless otherwise documented below in the visit note. 

## 2016-04-17 ENCOUNTER — Ambulatory Visit: Payer: 59 | Admitting: Family Medicine

## 2016-04-17 ENCOUNTER — Telehealth: Payer: Self-pay | Admitting: Family Medicine

## 2016-04-17 NOTE — Telephone Encounter (Signed)
[facility] has done a PA on this. Waiting for reply.

## 2016-04-17 NOTE — Telephone Encounter (Signed)
Onalee HuaDavid from [employer] called to get a prior authorization for rx phentermine 15 MG capsule  Prior authorization is needed in order for patient to receive.  If you have any questions please call [phone] or Provider services: [phone]  Please call patient once this is done.

## 2016-04-17 NOTE — Telephone Encounter (Signed)
PA in process.  Waiting for decision through CoverMyMeds.

## 2016-04-18 ENCOUNTER — Telehealth: Payer: Self-pay

## 2016-04-18 NOTE — Telephone Encounter (Signed)
Patient calling to ask if there would be any problem with her taking diclofenac and phentermine.  Advised patient that those medications are safe to take together.  Also advised that patient should not take ibuprofen or Aleve while taking diclofenac.  Patient verbalized understanding to all.

## 2016-04-18 NOTE — Telephone Encounter (Signed)
PA approved through 07/18/2016.  Reference number:  [id number].  Patient notified.

## 2016-05-20 ENCOUNTER — Other Ambulatory Visit: Payer: Self-pay

## 2016-05-20 DIAGNOSIS — E8881 Metabolic syndrome: Secondary | ICD-10-CM

## 2016-05-20 MED ORDER — METFORMIN HCL 500 MG PO TABS: 500.0000 mg | ORAL_TABLET | Freq: Every day | ORAL | 1 refills | Status: DC

## 2016-06-26 ENCOUNTER — Ambulatory Visit (INDEPENDENT_AMBULATORY_CARE_PROVIDER_SITE_OTHER): Payer: 59 | Admitting: Family Medicine

## 2016-06-26 ENCOUNTER — Encounter: Payer: Self-pay | Admitting: Family Medicine

## 2016-06-26 VITALS — BP 136/82 | HR 88 | Temp 98.4°F | Ht 69.5 in | Wt 336.6 lb

## 2016-06-26 DIAGNOSIS — R609 Edema, unspecified: Secondary | ICD-10-CM

## 2016-06-26 DIAGNOSIS — I10 Essential (primary) hypertension: Secondary | ICD-10-CM | POA: Diagnosis not present

## 2016-06-26 MED ORDER — PHENTERMINE HCL 15 MG PO CAPS: 15.0000 mg | ORAL_CAPSULE | ORAL | 0 refills | Status: DC

## 2016-06-26 MED ORDER — HYDROCHLOROTHIAZIDE 12.5 MG PO CAPS: 12.5000 mg | ORAL_CAPSULE | Freq: Every day | ORAL | 1 refills | Status: DC

## 2016-06-26 NOTE — Progress Notes (Signed)
Bethany Conley is a 46 y.o. female is here for follow up.  History of Present Illness:   Water quality scientist, CMA, acting as scribe for Dr. Juleen China.  CC:  Patient comes in today for follow up.  States she has been doing well.  She has lost 11 pounds since her last visit.  Home blood pressures in the normal range, per patient.  She is concerned about some swelling in her left ankle.  Patient states she believes this is fluid.  HPI  Hypertension Home blood pressure readings have been in the 130s/80s range, per patient.  Avoiding excessive salt intake? [x]  YES  []  NO Trying to exercise on a regular basis? [x]  YES  []  NO Review: taking medications as instructed, no medication side effects noted, no TIAs, no chest pain on exertion, no dyspnea on exertion, + noting swelling of ankles.   Wt Readings from Last 3 Encounters:  06/26/16 (!) 336 lb 9.6 oz (152.7 kg)  04/16/16 (!) 347 lb (157.4 kg)  04/10/16 (!) 345 lb 6.4 oz (156.7 kg)    reports that she has never smoked. She has never used smokeless tobacco. BP Readings from Last 3 Encounters:  06/26/16 136/82  04/16/16 140/82  04/10/16 124/82   Lab Results  Component Value Date   CREATININE 0.80 04/10/2016   There are no preventive care reminders to display for this patient.  PMHx, SurgHx, SocialHx, FamHx, Medications, and Allergies were reviewed in the Visit Navigator and updated as appropriate.   Patient Active Problem List   Diagnosis Date Noted  . Morbid obesity ([place]) 04/10/2016  . Arthralgia of left knee 04/10/2016  . Arthralgia of left ankle 04/10/2016  . Iron deficiency anemia due to chronic blood loss 04/10/2016  . Menorrhagia with regular cycle 04/10/2016  . Essential hypertension 04/10/2016   Social History  Substance Use Topics  . Smoking status: Never Smoker  . Smokeless tobacco: Never Used  . Alcohol use Not on file   Current Medications and Allergies:   .  Blood Pressure KIT, To check BP daily., Disp: 1 each,  Rfl: 0 .  diclofenac (VOLTAREN) 75 MG EC tablet, , Disp: , Rfl:  .  docusate sodium (COLACE) 100 MG capsule, Take 100 mg by mouth 2 (two) times daily., Disp: , Rfl: 2 .  ferrous sulfate 325 (65 FE) MG tablet, Take 325 mg by mouth 2 (two) times daily., Disp: , Rfl: 4 .  metFORMIN (GLUCOPHAGE) 500 MG tablet, Take 1 tablet (500 mg total) by mouth daily with breakfast., Disp: 90 tablet, Rfl: 1 .  tranexamic acid (LYSTEDA) 650 MG TABS tablet, , Disp: , Rfl:  .  hydrochlorothiazide (MICROZIDE) 12.5 MG capsule, Take 1 capsule (12.5 mg total) by mouth daily., Disp: 30 capsule, Rfl: 1 .  phentermine 15 MG capsule, Take 1 capsule (15 mg total) by mouth every morning., Disp: 30 capsule, Rfl: 0 .  phentermine 15 MG capsule, Take 1 capsule (15 mg total) by mouth every morning., Disp: 30 capsule, Rfl: 0  Allergies  Allergen Reactions  . Sulfa Antibiotics Rash   Review of Systems   Review of Systems  Constitutional: Negative for chills and fever.  HENT: Negative for congestion, ear pain and sore throat.   Eyes: Negative for blurred vision and pain.  Respiratory: Negative for cough and shortness of breath.   Cardiovascular: Positive for leg swelling. Negative for chest pain and palpitations.       Left ankle  Gastrointestinal: Negative for  abdominal pain, nausea and vomiting.  Genitourinary: Negative for frequency.  Musculoskeletal: Negative for back pain and neck pain.  Skin: Negative for rash.  Neurological: Negative for dizziness, seizures and headaches.  Psychiatric/Behavioral: Negative for depression. The patient is not nervous/anxious.    Vitals:   Vitals:   06/26/16 0817  BP: 136/82  Pulse: 88  Temp: 98.4 F (36.9 C)  TempSrc: Oral  SpO2: 97%  Weight: (!) 336 lb 9.6 oz (152.7 kg)  Height: 5' 9.5" (1.765 m)     Body mass index is 48.99 kg/m.  Physical Exam:   Physical Exam  Constitutional: She appears well-nourished.  HENT:  Head: Normocephalic and atraumatic.  Eyes: EOM are  normal. Pupils are equal, round, and reactive to light.  Neck: Normal range of motion. Neck supple.  Cardiovascular: Normal rate, regular rhythm, normal heart sounds and intact distal pulses.   Pulmonary/Chest: Effort normal.  Abdominal: Soft.  Musculoskeletal:       Feet:  Skin: Skin is warm.  Psychiatric: She has a normal mood and affect. Her behavior is normal.  Nursing note and vitals reviewed.    Assessment and Plan:   Bethany Conley was seen today for follow-up, obesity and hypertension.  Diagnoses and all orders for this visit:  Edema, unspecified type Comments: HCTZ as below. After discussion, patient would like to start below medication. Expectations, risks, and potential side effects reviewed. Will recheck labs. Orders: -     hydrochlorothiazide (MICROZIDE) 12.5 MG capsule; Take 1 capsule (12.5 mg total) by mouth daily.  Morbid obesity ([place]) Comments: Doing well. Down 9 pounds and feeling good. Phentermine as below. Reviewed healthy diet choices and exercise. Orders: -    phentermine 15 MG capsule; Take 1 capsule (15 mg total) by mouth every morning. -     phentermine 15 MG capsule; Take 1 capsule (15 mg total) by mouth every morning.  Essential hypertension Comments: Well controlled.  No signs of complications, medication side effects, or red flags.  Continue current regimen.     . Reviewed expectations re: course of current medical issues. . Discussed self-management of symptoms. . Outlined signs and symptoms indicating need for more acute intervention. . Patient verbalized understanding and all questions were answered. Marland Kitchen Health Maintenance issues including appropriate healthy diet, exercise, and smoking avoidance were discussed with patient. . See orders for this visit as documented in the electronic medical record. . Patient received an After Visit Summary.  CMA served as Education administrator during this visit. History, Physical, and Plan performed by medical provider. The above  documentation has been reviewed and is accurate and complete. Briscoe Deutscher, D.O.  Briscoe Deutscher, DO [place] 06/26/2016  Future Appointments Date Time Provider Tobias  08/28/2016 7:30 AM Briscoe Deutscher, DO [facility] None

## 2016-08-09 ENCOUNTER — Telehealth: Payer: Self-pay | Admitting: Family Medicine

## 2016-08-09 NOTE — Telephone Encounter (Signed)
PA has been initiated through CoverMyMeds.  Waiting for decision.

## 2016-08-09 NOTE — Telephone Encounter (Signed)
Please call [facility] for phentermine. Pt states that the pharmacy has faxed it over several times and we have no record of the fax.

## 2016-08-09 NOTE — Telephone Encounter (Signed)
Spoke with pharmacy and patient. Patient wants to try to get PA through her insurance company. The pharmacy has used a discount card and not sent a PA form. Patient is going to call the pharmacy to have them resubmit to where they do a PA.

## 2016-08-12 NOTE — Telephone Encounter (Signed)
PA approved through 02/09/2017.  Patient's pharmacy has been notified.

## 2016-08-28 ENCOUNTER — Ambulatory Visit: Payer: 59 | Admitting: Family Medicine

## 2016-09-09 ENCOUNTER — Telehealth: Payer: Self-pay | Admitting: Family Medicine

## 2016-09-09 ENCOUNTER — Ambulatory Visit (INDEPENDENT_AMBULATORY_CARE_PROVIDER_SITE_OTHER): Payer: 59 | Admitting: Family Medicine

## 2016-09-09 ENCOUNTER — Encounter: Payer: Self-pay | Admitting: Family Medicine

## 2016-09-09 VITALS — BP 124/74 | Temp 98.4°F | Ht 69.5 in | Wt 336.8 lb

## 2016-09-09 DIAGNOSIS — D500 Iron deficiency anemia secondary to blood loss (chronic): Secondary | ICD-10-CM | POA: Diagnosis not present

## 2016-09-09 DIAGNOSIS — I10 Essential (primary) hypertension: Secondary | ICD-10-CM | POA: Diagnosis not present

## 2016-09-09 LAB — COMPREHENSIVE METABOLIC PANEL
ALT: 8 U/L (ref 0–35)
AST: 13 U/L (ref 0–37)
Albumin: 3.9 g/dL (ref 3.5–5.2)
Alkaline Phosphatase: 92 U/L (ref 39–117)
BUN: 8 mg/dL (ref 6–23)
CO2: 26 mEq/L (ref 19–32)
Calcium: 9 mg/dL (ref 8.4–10.5)
Chloride: 102 mEq/L (ref 96–112)
Creatinine, Ser: 0.75 mg/dL (ref 0.40–1.20)
GFR: 106.38 mL/min (ref >60.00)
Glucose, Bld: 82 mg/dL (ref 70–99)
Potassium: 3.8 mEq/L (ref 3.5–5.1)
Sodium: 136 mEq/L (ref 135–145)
Total Bilirubin: 0.4 mg/dL (ref 0.2–1.2)
Total Protein: 7.7 g/dL (ref 6.0–8.3)

## 2016-09-09 LAB — CBC WITH DIFFERENTIAL/PLATELET
Basophils Absolute: 0.1 10*3/uL (ref 0.0–0.1)
Basophils Relative: 0.6 % (ref 0.0–3.0)
Eosinophils Absolute: 0.2 10*3/uL (ref 0.0–0.7)
Eosinophils Relative: 2.3 % (ref 0.0–5.0)
HCT: 33.2 % — ABNORMAL LOW (ref 36.0–46.0)
Hemoglobin: 10.7 g/dL — ABNORMAL LOW (ref 12.0–15.0)
Lymphocytes Relative: 21.8 % (ref 12.0–46.0)
Lymphs Abs: 2.3 10*3/uL (ref 0.7–4.0)
MCHC: 32.2 g/dL (ref 30.0–36.0)
MCV: 87.7 fl (ref 78.0–100.0)
Monocytes Absolute: 1.1 10*3/uL — ABNORMAL HIGH (ref 0.1–1.0)
Monocytes Relative: 10.2 % (ref 3.0–12.0)
Neutro Abs: 6.8 10*3/uL (ref 1.4–7.7)
Neutrophils Relative %: 65.1 % (ref 43.0–77.0)
Platelets: 445 10*3/uL — ABNORMAL HIGH (ref 150.0–400.0)
RBC: 3.79 Mil/uL — ABNORMAL LOW (ref 3.87–5.11)
RDW: 13.2 % (ref 11.5–15.5)
WBC: 10.5 10*3/uL (ref 4.0–10.5)

## 2016-09-09 LAB — FERRITIN: Ferritin: 7 ng/mL — ABNORMAL LOW (ref 10.0–291.0)

## 2016-09-09 MED ORDER — PHENTERMINE HCL 37.5 MG PO TABS: 37.5000 mg | ORAL_TABLET | Freq: Every day | ORAL | 3 refills | Status: DC

## 2016-09-09 NOTE — Telephone Encounter (Signed)
Patient wants to know if PA was received from pharm yet?  Thank you,  -LL

## 2016-09-09 NOTE — Telephone Encounter (Signed)
There is a request pending in Cover My Meds.  Will complete PA after we are finished seeing patients.

## 2016-09-09 NOTE — Progress Notes (Signed)
Bethany Conley is a 46 y.o. female is here for follow up.  History of Present Illness:   Bethany Conley acting as scribe for Bethany Conley.  HPI: Patient comes in today to follow up for hypertension and weight. She has not lost any more weight since her last visit.  1. Essential hypertension.   Home blood pressure readings at goal.  Avoiding excessive salt intake? [x]   YES  []   NO Trying to exercise on a regular basis? []   YES  [x]   NO Review: taking medications as instructed, no medication side effects noted, no TIAs, no chest pain on exertion, no dyspnea on exertion. Swelling of ankles much improved with HCTZ.   Wt Readings from Last 3 Encounters:  09/09/16 (!) 336 lb 12.8 oz (152.8 kg)  06/26/16 (!) 336 lb 9.6 oz (152.7 kg)  04/16/16 (!) 347 lb (157.4 kg)    reports that she has never smoked. She has never used smokeless tobacco. BP Readings from Last 3 Encounters:  09/09/16 124/74  06/26/16 136/82  04/16/16 140/82   Lab Results  Component Value Date   CREATININE 0.80 04/10/2016    2. Morbid obesity (HCC).  Not exercising or eating well. States due to son's baseball schedule. Started taking 2 phentermine on her own during the last week and tolerating well.  Wt Readings from Last 3 Encounters:  09/09/16 (!) 336 lb 12.8 oz (152.8 kg)  06/26/16 (!) 336 lb 9.6 oz (152.7 kg)  04/16/16 (!) 347 lb (157.4 kg)     3. Iron deficiency anemia due to chronic blood loss.  CBC Latest Ref Rng & Units 04/10/2016 05/25/2015  WBC 4.0 - 10.5 K/uL 8.8 11.7(A)  Hemoglobin 12.0 - 15.0 g/dL 10.7(L) 10.3(A)  Hematocrit 36.0 - 46.0 % 32.7(L) 30.7(A)  Platelets 150.0 - 400.0 K/uL 399.0 -   No longer on Lysteda. She has not followed up with GYN yet due to cost. Thought to be in early menopause, with irregular and heavy menses. GYN wants to obtain further testing, including [place].    Depression screen [facility] 2/9 09/09/2016 05/25/2015  Decreased Interest 0 0  Down, Depressed, Hopeless 0 0  PHQ - 2  Score 0 0   PMHx, SurgHx, SocialHx, FamHx, Medications, and Allergies were reviewed in the Visit Navigator and updated as appropriate.   Patient Active Problem List   Diagnosis Date Noted  . Morbid obesity (HCC) 04/10/2016  . Arthralgia of left knee 04/10/2016  . Arthralgia of left ankle 04/10/2016  . Iron deficiency anemia due to chronic blood loss 04/10/2016  . Menorrhagia with regular cycle 04/10/2016  . Essential hypertension 04/10/2016   Social History  Substance Use Topics  . Smoking status: Never Smoker  . Smokeless tobacco: Never Used  . Alcohol use Not on file   Current Medications and Allergies:   .  diclofenac (VOLTAREN) 75 MG EC tablet, , Disp: , Rfl:  .  docusate sodium (COLACE) 100 MG capsule, Take 100 mg by mouth 2 (two) times daily., Disp: , Rfl: 2 .  hydrochlorothiazide (MICROZIDE) 12.5 MG capsule, Take 1 capsule (12.5 mg total) by mouth daily., Disp: 30 capsule, Rfl: 1 .  metFORMIN (GLUCOPHAGE) 500 MG tablet, Take 1 tablet (500 mg total) by mouth daily with breakfast. (Patient not taking: Reported on 09/09/2016), Disp: 90 tablet, Rfl: 1 .  phentermine (ADIPEX-P) 15 MG capsule, Take 1 by mouth daily before breakfast., Disp: 30 tablet, Rfl: 3 (Patient taking two daily)   Allergies  Allergen Reactions  .  Sulfa Antibiotics Rash   Review of Systems   Pertinent items are noted in the HPI. Otherwise, ROS is negative.  Vitals:   Vitals:   09/09/16 0727  BP: 124/74  Temp: 98.4 F (36.9 C)  TempSrc: Oral  Weight: (!) 336 lb 12.8 oz (152.8 kg)  Height: 5' 9.5" (1.765 m)     Body mass index is 49.02 kg/m.  Physical Exam:   Physical Exam  Constitutional: She is oriented to person, place, and time. She appears well-developed and well-nourished. No distress.  HENT:  Head: Normocephalic and atraumatic.  Eyes: Pupils are equal, round, and reactive to light. EOM are normal.  Neck: Normal range of motion. Neck supple.  Cardiovascular: Normal rate, regular  rhythm, normal heart sounds and intact distal pulses.   Pulmonary/Chest: Effort normal and breath sounds normal.  Abdominal: Soft. Bowel sounds are normal.  Musculoskeletal: She exhibits no edema.  Neurological: She is alert and oriented to person, place, and time.  Skin: Skin is warm. Capillary refill takes less than 2 seconds.  Psychiatric: She has a normal mood and affect. Her behavior is normal.  Nursing note and vitals reviewed.   Assessment and Plan:   Diagnoses and all orders for this visit:  Essential hypertension Comments: Well controlled.  No signs of complications, medication side effects, or red flags.  Continue current regimen. Orders: -     Comprehensive metabolic panel  Morbid obesity (HCC) Comments: The patient is asked to make an attempt to improve diet and exercise patterns to aid in medical management of this problem. Phentermine increased today. We reviewed healthy habits. Orders: -     phentermine (ADIPEX-P) 37.5 MG tablet; Take 1 tablet (37.5 mg total) by mouth daily before breakfast.  Iron deficiency anemia due to chronic blood loss Comments: Will recheck labs today. Orders: -     CBC with Differential/Platelet -     Ferritin   . Reviewed expectations re: course of current medical issues. . Discussed self-management of symptoms. . Outlined signs and symptoms indicating need for more acute intervention. . Patient verbalized understanding and all questions were answered. Marland Kitchen. Health Maintenance issues including appropriate healthy diet, exercise, and smoking avoidance were discussed with patient. . See orders for this visit as documented in the electronic medical record. . Patient received an After Visit Summary.  Conley served as Neurosurgeonscribe during this visit. History, Physical, and Plan performed by medical provider. The above documentation has been reviewed and is accurate and complete. Bethany Conley, D.O.  Bethany RimaErica Dylen Mcelhannon, DO [place] 09/09/2016

## 2016-09-09 NOTE — Telephone Encounter (Signed)
Patient called in reference to pharmacy needing PA for Rx phentermine (ADIPEX-P) 37.5 MG tablet

## 2016-09-09 NOTE — Patient Instructions (Signed)
It was nice to see you today!  Restart the Metformin.  Take the HCTZ daily.   We are increasing your Phentermine.

## 2016-09-11 NOTE — Telephone Encounter (Signed)
PA in progress.  Awaiting decision from patient's insurance.

## 2016-09-12 NOTE — Telephone Encounter (Signed)
Pharmacy was also calling to check the status of the note below, I advised that we are waiting to hear back from the patient;s insurance for the PA for phentermine (ADIPEX-P) 37.5 MG tablet

## 2016-09-12 NOTE — Telephone Encounter (Signed)
PA is still in progress.  Pharmacy will automatically be notified of decision.  I will also fax decision when it becomes available.

## 2016-09-13 NOTE — Telephone Encounter (Signed)
PA for phentermine denied.  Determination faxed to pharmacy.  Patient will have to pay out of pocket for medication.  This is not unusual for this medication.

## 2016-09-16 ENCOUNTER — Other Ambulatory Visit: Payer: Self-pay | Admitting: Family Medicine

## 2016-09-16 DIAGNOSIS — Z1231 Encounter for screening mammogram for malignant neoplasm of breast: Secondary | ICD-10-CM

## 2016-09-17 ENCOUNTER — Telehealth: Payer: Self-pay | Admitting: Family Medicine

## 2016-09-17 NOTE — Telephone Encounter (Signed)
There was not a 5% weight loss.  Patient's weight was the same in August as it was in May.

## 2016-09-17 NOTE — Telephone Encounter (Signed)
[employer] is calling to advise that the PA for phentermine has been denied due to needing medical notes. Notes need to show that there has been at least a 5% weight loss since the last prescription.   [employer] states that it will require a new PA to change this. She recommends that you call [phone]

## 2016-09-18 ENCOUNTER — Ambulatory Visit
Admission: RE | Admit: 2016-09-18 | Discharge: 2016-09-18 | Disposition: A | Discharge status: Home / Self Care | Payer: 59 | Source: Ambulatory Visit | Attending: Family Medicine | Admitting: Family Medicine

## 2016-09-18 DIAGNOSIS — Z1231 Encounter for screening mammogram for malignant neoplasm of breast: Secondary | ICD-10-CM

## 2016-09-20 NOTE — Telephone Encounter (Signed)
I have advised patient's pharmacy twice and have advised [employer] that patient did not have a 5% weight loss.  I also indicated this when filling out the PA form.  Patient will have to pay out of pocked for the phentermine if she wants to continue to take it.

## 2016-10-19 ENCOUNTER — Other Ambulatory Visit: Payer: Self-pay | Admitting: Family Medicine

## 2016-10-19 DIAGNOSIS — R609 Edema, unspecified: Secondary | ICD-10-CM

## 2016-12-09 ENCOUNTER — Ambulatory Visit: Payer: 59 | Admitting: Family Medicine

## 2017-01-25 ENCOUNTER — Other Ambulatory Visit: Payer: Self-pay | Admitting: Family Medicine

## 2017-02-01 ENCOUNTER — Ambulatory Visit (HOSPITAL_COMMUNITY)
Admission: EM | Admit: 2017-02-01 | Discharge: 2017-02-01 | Disposition: A | Discharge status: Home / Self Care | Payer: 59 | Attending: Orthopedic Surgery | Admitting: Orthopedic Surgery

## 2017-02-01 ENCOUNTER — Other Ambulatory Visit: Payer: Self-pay

## 2017-02-01 ENCOUNTER — Encounter (HOSPITAL_COMMUNITY): Payer: Self-pay | Admitting: *Deleted

## 2017-02-01 DIAGNOSIS — J069 Acute upper respiratory infection, unspecified: Secondary | ICD-10-CM

## 2017-02-01 MED ORDER — HYDROCOD POLST-CPM POLST ER 10-8 MG/5ML PO SUER: 5.0000 mL | Freq: Every evening | ORAL | 0 refills | Status: DC | PRN

## 2017-02-01 MED ORDER — PSEUDOEPH-BROMPHEN-DM 30-2-10 MG/5ML PO SYRP: 5.0000 mL | ORAL_SOLUTION | Freq: Three times a day (TID) | ORAL | 0 refills | Status: DC | PRN

## 2017-02-01 NOTE — ED Provider Notes (Signed)
[place]    CSN: [id number] Arrival date & time: 02/01/17  1939     History   Chief Complaint Chief Complaint  Patient presents with  . Nasal Congestion  . Cough    HPI Bethany Conley is a 46 y.o. female presents to the urgent care facility for evaluation of cough congestion runny nose times 4 days.  No fevers.  No body aches.  No nausea vomiting or rashes.  She denies any chest pain shortness of breath or abdominal pain.  She has been taking Mucinex with mild relief.  Cough times severe at nighttime.  HPI  History reviewed. No pertinent past medical history.  Patient Active Problem List   Diagnosis Date Noted  . Morbid obesity ([place]) 04/10/2016  . Arthralgia of left knee 04/10/2016  . Arthralgia of left ankle 04/10/2016  . Iron deficiency anemia due to chronic blood loss 04/10/2016  . Menorrhagia with regular cycle 04/10/2016  . Essential hypertension 04/10/2016    Past Surgical History:  Procedure Laterality Date  . TUBAL LIGATION      OB History    No data available       Home Medications    Prior to Admission medications   Medication Sig Start Date End Date Taking? Authorizing Provider  hydrochlorothiazide (MICROZIDE) 12.5 MG capsule TAKE 1 CAPSULE BY MOUTH EVERY DAY 10/21/16  Yes Briscoe Deutscher, DO  Blood Pressure KIT To check BP daily. 04/10/16   Briscoe Deutscher, DO  brompheniramine-pseudoephedrine-DM 30-2-10 MG/5ML syrup Take 5 mLs by mouth 3 (three) times daily as needed. 02/01/17   Duanne Guess, PA-C  chlorpheniramine-HYDROcodone (TUSSIONEX PENNKINETIC ER) 10-8 MG/5ML SUER Take 5 mLs by mouth at bedtime as needed for cough. 02/01/17   Duanne Guess, PA-C  diclofenac (VOLTAREN) 75 MG EC tablet  03/20/16   [provider]  docusate sodium (COLACE) 100 MG capsule Take 100 mg by mouth 2 (two) times daily. 03/20/16   [provider]  metFORMIN (GLUCOPHAGE) 500 MG tablet Take 1 tablet (500 mg total) by mouth daily with  breakfast. Patient not taking: Reported on 09/09/2016 05/20/16   Briscoe Deutscher, DO  phentermine (ADIPEX-P) 37.5 MG tablet Take 1 tablet (37.5 mg total) by mouth daily before breakfast. 09/09/16   Briscoe Deutscher, DO  tranexamic acid (LYSTEDA) 650 MG TABS tablet  03/21/16   [provider]    Family History No family history on file.  Social History Social History   Tobacco Use  . Smoking status: Never Smoker  . Smokeless tobacco: Never Used  Substance Use Topics  . Alcohol use: No    Frequency: Never  . Drug use: No     Allergies   Sulfa antibiotics   Review of Systems Review of Systems  Constitutional: Negative for fever.  HENT: Positive for congestion, rhinorrhea and sore throat. Negative for ear discharge, sinus pressure, sinus pain, trouble swallowing and voice change.   Respiratory: Positive for cough. Negative for shortness of breath, wheezing and stridor.   Cardiovascular: Negative for chest pain.  Gastrointestinal: Negative for abdominal pain, diarrhea, nausea and vomiting.  Genitourinary: Negative for dysuria, flank pain and pelvic pain.  Musculoskeletal: Positive for myalgias. Negative for back pain.  Skin: Negative for rash.  Neurological: Negative for dizziness and headaches.     Physical Exam Triage Vital Signs ED Triage Vitals [02/01/17 1959]  Enc Vitals Group     BP (!) 147/85     Pulse Rate (!) 104     Resp  20     Temp 98.2 F (36.8 C)     Temp Source Oral     SpO2 100 %     Weight      Height      Head Circumference      Peak Flow      Pain Score      Pain Loc      Pain Edu?      Excl. in [place]?    No data found.  Updated Vital Signs BP (!) 147/85   Pulse (!) 104   Temp 98.2 F (36.8 C) (Oral)   Resp 20   LMP 01/13/2017 (Approximate)   SpO2 100%   Visual Acuity Right Eye Distance:   Left Eye Distance:   Bilateral Distance:    Right Eye Near:   Left Eye Near:    Bilateral Near:     Physical Exam  Constitutional: She is  oriented to person, place, and time. She appears well-developed and well-nourished. No distress.  HENT:  Head: Normocephalic and atraumatic.  Right Ear: Hearing, tympanic membrane, external ear and ear canal normal.  Left Ear: Hearing, tympanic membrane, external ear and ear canal normal.  Nose: Rhinorrhea present.  Mouth/Throat: Mucous membranes are normal. No trismus in the jaw. No uvula swelling. Posterior oropharyngeal erythema present. No oropharyngeal exudate, posterior oropharyngeal edema or tonsillar abscesses. No tonsillar exudate.  Eyes: Conjunctivae are normal. Right eye exhibits no discharge. Left eye exhibits no discharge.  Neck: Normal range of motion.  Cardiovascular: Normal rate and regular rhythm.  Pulmonary/Chest: Effort normal and breath sounds normal. No stridor. No respiratory distress. She has no wheezes. She has no rales.  Abdominal: Soft. She exhibits no distension. There is no tenderness.  Musculoskeletal: Normal range of motion. She exhibits no deformity.  Lymphadenopathy:    She has cervical adenopathy.  Neurological: She is alert and oriented to person, place, and time. She has normal reflexes.  Skin: Skin is warm and dry.  Psychiatric: She has a normal mood and affect. Her behavior is normal. Thought content normal.     UC Treatments / Results  Labs (all labs ordered are listed, but only abnormal results are displayed) Labs Reviewed - No data to display  EKG  EKG Interpretation None       Radiology No results found.  Procedures Procedures (including critical care time)  Medications Ordered in UC Medications - No data to display   Initial Impression / Assessment and Plan / UC Course  I have reviewed the triage vital signs and the nursing notes.  Pertinent labs & imaging results that were available during my care of the patient were reviewed by me and considered in my medical decision making (see chart for details).     46 year old female  viral respiratory infection.  She is given prescription for daytime and nighttime cough medication.  She is educated drink lots of fluids.  She is educated on signs and symptoms return to the ED or clinic for.  Final Clinical Impressions(s) / UC Diagnoses   Final diagnoses:  Viral upper respiratory tract infection    ED Discharge Orders        Ordered    chlorpheniramine-HYDROcodone (TUSSIONEX PENNKINETIC ER) 10-8 MG/5ML SUER  At bedtime PRN     02/01/17 2017    brompheniramine-pseudoephedrine-DM 30-2-10 MG/5ML syrup  3 times daily PRN     02/01/17 2017        Duanne Guess, PA-C 02/01/17 2020

## 2017-02-01 NOTE — ED Triage Notes (Signed)
C/O congestion and cold sxs since 12/24.  Has been Mucinex and OTC meds, but feels she is getting worse.

## 2017-02-01 NOTE — Discharge Instructions (Signed)
Please take daytime cough medication with nighttime cough medication.  Make sure drinking lots of fluids.  Tylenol or ibuprofen as needed for body aches.  Please return to the emergency department for any fevers that are not going down Tylenol or ibuprofen, worsening cough, shortness of breath or any urgent changes in your health.

## 2017-02-17 ENCOUNTER — Ambulatory Visit (INDEPENDENT_AMBULATORY_CARE_PROVIDER_SITE_OTHER): Payer: 59 | Admitting: Family Medicine

## 2017-02-17 ENCOUNTER — Encounter: Payer: Self-pay | Admitting: Family Medicine

## 2017-02-17 VITALS — BP 124/72 | HR 92 | Temp 98.7°F | Wt 342.8 lb

## 2017-02-17 DIAGNOSIS — E559 Vitamin D deficiency, unspecified: Secondary | ICD-10-CM | POA: Diagnosis not present

## 2017-02-17 DIAGNOSIS — D500 Iron deficiency anemia secondary to blood loss (chronic): Secondary | ICD-10-CM | POA: Diagnosis not present

## 2017-02-17 DIAGNOSIS — Z0000 Encounter for general adult medical examination without abnormal findings: Secondary | ICD-10-CM | POA: Diagnosis not present

## 2017-02-17 LAB — CBC
HCT: 32.8 % — ABNORMAL LOW (ref 36.0–46.0)
Hemoglobin: 10.6 g/dL — ABNORMAL LOW (ref 12.0–15.0)
MCHC: 32.4 g/dL (ref 30.0–36.0)
MCV: 91.1 fl (ref 78.0–100.0)
Platelets: 354 10*3/uL (ref 150.0–400.0)
RBC: 3.6 Mil/uL — ABNORMAL LOW (ref 3.87–5.11)
RDW: 13 % (ref 11.5–15.5)
WBC: 8.8 10*3/uL (ref 4.0–10.5)

## 2017-02-17 LAB — COMPREHENSIVE METABOLIC PANEL
ALT: 11 U/L (ref 0–35)
AST: 14 U/L (ref 0–37)
Albumin: 3.8 g/dL (ref 3.5–5.2)
Alkaline Phosphatase: 84 U/L (ref 39–117)
BUN: 11 mg/dL (ref 6–23)
CO2: 28 mEq/L (ref 19–32)
Calcium: 8.9 mg/dL (ref 8.4–10.5)
Chloride: 103 mEq/L (ref 96–112)
Creatinine, Ser: 0.64 mg/dL (ref 0.40–1.20)
GFR: 127.51 mL/min (ref >60.00)
Glucose, Bld: 87 mg/dL (ref 70–99)
Potassium: 4 mEq/L (ref 3.5–5.1)
Sodium: 136 mEq/L (ref 135–145)
Total Bilirubin: 0.3 mg/dL (ref 0.2–1.2)
Total Protein: 7.8 g/dL (ref 6.0–8.3)

## 2017-02-17 MED ORDER — PHENTERMINE HCL 37.5 MG PO TABS: ORAL_TABLET | ORAL | 2 refills | Status: DC

## 2017-02-17 NOTE — Progress Notes (Signed)
Subjective:    Bethany Conley is a 47 y.o. female and is here for a comprehensive physical exam.  Pertinent Gynecological History: Patient's last menstrual period was 02/11/2017.  PMHx, SurgHx, SocialHx, Medications, and Allergies were reviewed in the Visit Navigator and updated as appropriate.   No past medical history on file.  Past Surgical History:  Procedure Laterality Date  . TUBAL LIGATION     No family history on file. Social History   Tobacco Use  . Smoking status: Never Smoker  . Smokeless tobacco: Never Used  Substance Use Topics  . Alcohol use: No    Frequency: Never  . Drug use: No   Review of Systems:   Pertinent items are noted in the HPI. Otherwise, ROS is negative.  Objective:   BP 124/72   Pulse 92   Temp 98.7 F (37.1 C) (Oral)   Wt (!) 342 lb 12.8 oz (155.5 kg)   LMP 02/11/2017   SpO2 96%   BMI 49.90 kg/m    Wt Readings from Last 3 Encounters:  02/17/17 (!) 342 lb 12.8 oz (155.5 kg)  09/09/16 (!) 336 lb 12.8 oz (152.8 kg)  06/26/16 (!) 336 lb 9.6 oz (152.7 kg)     Ht Readings from Last 3 Encounters:  09/09/16 5' 9.5" (1.765 m)  06/26/16 5' 9.5" (1.765 m)  04/16/16 5' 9.5" (1.765 m)   General appearance: alert, cooperative and appears stated age. Head: normocephalic, without obvious abnormality, atraumatic. Neck: no adenopathy, supple, symmetrical, trachea midline; thyroid not enlarged, symmetric, no tenderness/mass/nodules. Lungs: clear to auscultation bilaterally. Heart: regular rate and rhythm Abdomen: soft, non-tender; no masses,  no organomegaly. Extremities: extremities normal, atraumatic, no cyanosis or edema. Skin: skin color, texture, turgor normal, no rashes or lesions. Lymph: cervical, supraclavicular, and axillary nodes normal; no abnormal inguinal nodes palpated. Neurologic: grossly normal.                                  Assessment/Plan:   Bethany was seen today for annual exam.  Diagnoses and all orders for this  visit:  Routine physical examination -     Comp Met (CMET)  Morbid obesity ([place]) Comments: Today's weight: Weight: (!) 342 lb 12.8 oz (155.5 kg)   We discussed the diagnosis of obesity with Bethany Conley today and Bethany agreed to give [place] permission to discuss obesity behavioral modification therapy today.  Assess: This patient has a BMI of Body mass index is 49.9 kg/m. and is in the action stage of change.  Advise: We reviewed the multiple health risks associated with obesity and the benefits of weight loss the improve her medical conditions, including giving energy, helping with knee pain, and HTN. This will be lifelong treatment as obesity is a chronic disease.   Agree: Collaboratively select appropriate treatment goals and methods based on the patient's interest in and willingness to change the behavior. Healthy dietary options were reviewed with the patient. We focused on mindful eating.   General weight loss/lifestyle modification strategies discussed (elicit support from others; identify saboteurs; non-food rewards, etc).  Behavioral treatment: stress management.  Diet interventions: Portion control better and make smarter food choices, such as increase vegetables and decrease simple carbohydrates.   Informal exercise measures discussed, e.g. taking stairs instead of elevator.  Regular aerobic exercise program discussed.  Medication: See orders.  Assist: Using behavior change techniques (selfhelp and/or counseling), aid the patient in achieving agreed-upon goals by  acquiring the skills, confi dence, and social/ environmental supports for behavior change, supplemented with adjunctive medical treatments when appropriate.  Arrange: She will follow up in 3 months.  Orders: -     phentermine (ADIPEX-P) 37.5 MG tablet; One po q am, then 1/2 po in the afternoon  Iron deficiency anemia due to chronic blood loss Comments: Recheck today. Patient not tolerating iron supplement.  Okay with infusion if her Hgb and ferritin are still low. Orders: -     CBC -     Iron, TIBC and Ferritin Panel  Vitamin D deficiency Comments: -     Vitamin D 1,25 dihydroxy   Patient Counseling: [x]   Nutrition: Stressed importance of moderation in sodium/caffeine intake, saturated fat and cholesterol, caloric balance, sufficient intake of fresh fruits, vegetables, fiber, calcium, iron, and 1 mg of folate supplement per day (for females capable of pregnancy).  [x]   Stressed the importance of regular exercise.   [x]   Substance Abuse: Discussed cessation/primary prevention of tobacco, alcohol, or other drug use; driving or other dangerous activities under the influence; availability of treatment for abuse.   [x]   Injury prevention: Discussed safety belts, safety helmets, smoke detector, smoking near bedding or upholstery.   [x]   Sexuality: Discussed sexually transmitted diseases, partner selection, use of condoms, avoidance of unintended pregnancy  and contraceptive alternatives.  [x]   Dental health: Discussed importance of regular tooth brushing, flossing, and dental visits.  [x]   Health maintenance and immunizations reviewed. Please refer to Health maintenance section.   Erica Wallace, DO [facility]        

## 2017-02-19 ENCOUNTER — Other Ambulatory Visit: Payer: Self-pay | Admitting: Surgical

## 2017-02-19 DIAGNOSIS — D500 Iron deficiency anemia secondary to blood loss (chronic): Secondary | ICD-10-CM

## 2017-02-20 ENCOUNTER — Telehealth: Payer: Self-pay | Admitting: Family Medicine

## 2017-02-20 ENCOUNTER — Ambulatory Visit (HOSPITAL_COMMUNITY)
Admission: RE | Admit: 2017-02-20 | Discharge: 2017-02-20 | Disposition: A | Discharge status: Home / Self Care | Payer: 59 | Source: Ambulatory Visit | Attending: Family Medicine | Admitting: Family Medicine

## 2017-02-20 DIAGNOSIS — D500 Iron deficiency anemia secondary to blood loss (chronic): Secondary | ICD-10-CM | POA: Insufficient documentation

## 2017-02-20 LAB — IRON,TIBC AND FERRITIN PANEL
%SAT: 13 % (calc) (ref 11–50)
Ferritin: 14 ng/mL (ref 10–232)
Iron: 48 ug/dL (ref 40–190)
TIBC: 357 mcg/dL (calc) (ref 250–450)

## 2017-02-20 LAB — VITAMIN D 1,25 DIHYDROXY
Vitamin D 1, 25 (OH)2 Total: 32 pg/mL (ref 18–72)
Vitamin D2 1, 25 (OH)2: <8 pg/mL
Vitamin D3 1, 25 (OH)2: 32 pg/mL

## 2017-02-20 MED ORDER — SODIUM CHLORIDE 0.9 % IV SOLN
510.0000 mg | Freq: Once | INTRAVENOUS | Status: AC
  Administered 2017-02-20: 510 mg via INTRAVENOUS
  Filled 2017-02-20: qty 17

## 2017-02-20 NOTE — Telephone Encounter (Signed)
Copied from CRM [phone]. Topic: Quick Communication - See Telephone Encounter >> Feb 20, 2017 12:11 PM Guinevere FerrariMorris, Maisen Schmit E, NT wrote: CRM for notification. See Telephone encounter for: Patient is calling [facility] and was wanting to see if she needed to make a follow up appointment to see the doctor. Pt would like a call back   02/20/17.

## 2017-02-20 NOTE — Progress Notes (Signed)
PATIENT CARE CENTER NOTE  Diagnosis: Anemia    Provider: Dr. Earlene PlaterWallace   Procedure: Infusion of IV Feraheme   Note: Patient received infusion of IV iron (Feraheme). Patient tolerated infusion with no adverse reaction. Vitals taken 30 minutes after infusion were stable. Discharge instructions given to patient. Patient alert, oriented and ambulatory at discharge.

## 2017-02-20 NOTE — Discharge Instructions (Signed)

## 2017-02-21 NOTE — Telephone Encounter (Signed)
How soon do you want her to come back.

## 2017-02-21 NOTE — Telephone Encounter (Signed)
Hailey can you please call the patient and schedule a 2 month follow up.

## 2017-02-21 NOTE — Telephone Encounter (Signed)
2 months

## 2017-02-21 NOTE — Telephone Encounter (Signed)
Called patient and left VM for patient to call back and schedule 2 month f/u.

## 2017-02-24 ENCOUNTER — Encounter (HOSPITAL_COMMUNITY): Payer: 59

## 2017-04-08 ENCOUNTER — Telehealth: Payer: Self-pay | Admitting: Family Medicine

## 2017-04-08 ENCOUNTER — Telehealth: Payer: Self-pay

## 2017-04-08 NOTE — Telephone Encounter (Signed)
Copied from CRM [phone]. Topic: Quick Communication - See Telephone Encounter >> Apr 08, 2017  3:42 PM Windy KalataMichael, Dawna Jakes L, NT wrote: CRM for notification. See Telephone encounter for:  04/08/17.  Patient states she is trying to get a refill on phentermine (ADIPEX-P) 37.5 MG tablet  but it is requesting a prior authorization. Please advise.  [id number] [place]- [place] - [address]  [address] [place] [id number]  Phone: [phone] Fax: [phone]

## 2017-04-08 NOTE — Telephone Encounter (Signed)
See note

## 2017-04-08 NOTE — Telephone Encounter (Signed)
Prior auth for Phentermine  [id number]  Will have response in 72 hrs per Cover my meds.

## 2017-04-09 NOTE — Telephone Encounter (Signed)
I have started that auth yesterday.

## 2017-04-11 NOTE — Telephone Encounter (Signed)
Per insurance has denied. Do you want me to call for appeal?

## 2017-04-11 NOTE — Telephone Encounter (Signed)
Okay to try, but will likely not help. Can inform patient.

## 2017-04-15 NOTE — Telephone Encounter (Signed)
Sent patient my chart to let her know we will start appeal.

## 2017-05-01 NOTE — Telephone Encounter (Signed)
Appeal started. 

## 2017-07-15 ENCOUNTER — Other Ambulatory Visit: Payer: Self-pay | Admitting: Family Medicine

## 2017-07-15 NOTE — Telephone Encounter (Signed)
Scheduled patient to come in.

## 2017-07-18 ENCOUNTER — Ambulatory Visit: Payer: 59 | Admitting: Family Medicine

## 2017-07-28 ENCOUNTER — Ambulatory Visit: Payer: 59 | Admitting: Family Medicine

## 2017-08-13 ENCOUNTER — Other Ambulatory Visit: Payer: Self-pay | Admitting: Family Medicine

## 2017-08-14 NOTE — Telephone Encounter (Signed)
Last script: 02/17/2017 #45 2 rf Last o/v 02/17/2017 F/u 08/25/2017

## 2017-08-18 ENCOUNTER — Other Ambulatory Visit: Payer: Self-pay | Admitting: Family Medicine

## 2017-08-18 DIAGNOSIS — Z1231 Encounter for screening mammogram for malignant neoplasm of breast: Secondary | ICD-10-CM

## 2017-08-24 NOTE — Progress Notes (Signed)
Bethany Conley is a 47 y.o. female is here for follow up.  History of Present Illness:   HPI: Ready to start with weight loss attempt. Youngest son going to [employer], full basketball scholarship, in August. Middle son moving back to [place]. Oldest in [employer]. Ready to take care of her. Currently on no medications.   Review of Systems  Constitutional: Negative for chills, fever, malaise/fatigue and weight loss.  Respiratory: Negative for cough, shortness of breath and wheezing.   Cardiovascular: Positive for leg swelling. Negative for chest pain and palpitations.  Gastrointestinal: Negative for abdominal pain, constipation, diarrhea, nausea and vomiting.  Genitourinary: Negative for dysuria and urgency.  Musculoskeletal: Negative for joint pain and myalgias.  Skin: Negative for rash.  Neurological: Negative for dizziness and headaches.  Psychiatric/Behavioral: Negative for depression, substance abuse and suicidal ideas. The patient is not nervous/anxious.     Health Maintenance Due  Topic Date Due  . TETANUS/TDAP  05/18/1988   Depression screen [facility] 2/9 08/25/2017 09/09/2016 05/25/2015  Decreased Interest 0 0 0  Down, Depressed, Hopeless 0 0 0  PHQ - 2 Score 0 0 0   PMHx, SurgHx, SocialHx, FamHx, Medications, and Allergies were reviewed in the Visit Navigator and updated as appropriate.   Patient Active Problem List   Diagnosis Date Noted  . Morbid obesity ([facility]) 04/10/2016  . Arthralgia of left knee 04/10/2016  . Arthralgia of left ankle 04/10/2016  . Iron deficiency anemia due to chronic blood loss 04/10/2016  . Menorrhagia with regular cycle 04/10/2016  . Essential hypertension 04/10/2016   Social History   Tobacco Use  . Smoking status: Never Smoker  . Smokeless tobacco: Never Used  Substance Use Topics  . Alcohol use: No    Frequency: Never  . Drug use: No   Current Medications and Allergies:    . None   Allergies  Allergen Reactions  . Sulfa Antibiotics  Rash   Review of Systems   Pertinent items are noted in the HPI. Otherwise, ROS is negative.  Vitals:   Vitals:   08/25/17 0753  BP: 130/84  Pulse: 93  Temp: 98.1 F (36.7 C)  TempSrc: Oral  SpO2: 98%  Weight: (!) 351 lb 9.6 oz (159.5 kg)  Height: 5' 9.5" (1.765 m)     Body mass index is 51.18 kg/m.  Physical Exam:   Physical Exam  Constitutional: She appears well-nourished.  HENT:  Head: Normocephalic and atraumatic.  Eyes: Pupils are equal, round, and reactive to light. EOM are normal.  Neck: Normal range of motion. Neck supple.  Cardiovascular: Normal rate, regular rhythm, normal heart sounds and intact distal pulses.  Pulmonary/Chest: Effort normal.  Abdominal: Soft.  Skin: Skin is warm.  Psychiatric: She has a normal mood and affect. Her behavior is normal.  Nursing note and vitals reviewed.   Assessment and Plan:   Bethany Conley was seen today for follow-up.  Diagnoses and all orders for this visit:  Morbid obesity ([facility]) Comments:  Contraindications to weight loss: none. Patient readiness to commit to diet and activity changes: excellent. Barriers to weight loss: none.  Plan: 1. Diagnostic studies to rule out secondary causes of obesity: see below. 2. General patient education:   Average sustained weight loss in long-term studies w/lifestyle interventions alone is 10-15 lb.  Importance of long-term maintenance tx in weight loss.  Use non-food self-rewards to reinforce behavior changes.  Elicit support from others; identify saboteurs.  Practical target weight is usually around 2 BMI units below current  weight. 3. Diet interventions:   Risks of dieting were reviewed, including fatigue, temporary hair loss, gallstone formation, gout, and with very low calorie diets, electrolyte abnormalities, nutrient inadequacies, and loss of lean body mass. 4. Exercise intervention:   Informal measures, e.g. taking stairs instead of elevator.  Formal exercise regimen  options. 5. Other behavioral treatment: stress management. 6. Other treatment: Medication: Phentermine. 7. Patient to keep a weight log that we will review at follow up. 8. Follow up: 3 months and as needed. Orders: -     phentermine (ADIPEX-P) 37.5 MG tablet; One po in am and one po q afternoon  Essential hypertension Comments: Controlled.  Lower extremity edema Comments: L > R. Intermittent. Better with elevation. Okay prn HCTZ/K. Precautions reviewed. Orders: -     hydrochlorothiazide (MICROZIDE) 12.5 MG capsule; Take 1 capsule (12.5 mg total) by mouth daily. -     potassium chloride (K-DUR) 10 MEQ tablet; Take 1 tablet (10 mEq total) by mouth daily.  Insomnia, adjustment Comments: Trial Trazodone.  Orders: -     traZODone (DESYREL) 50 MG tablet; Take 0.5-1 tablets (25-50 mg total) by mouth at bedtime as needed for sleep.    . Reviewed expectations re: course of current medical issues. . Discussed self-management of symptoms. . Outlined signs and symptoms indicating need for more acute intervention. . Patient verbalized understanding and all questions were answered. Marland Kitchen. Health Maintenance issues including appropriate healthy diet, exercise, and smoking avoidance were discussed with patient. . See orders for this visit as documented in the electronic medical record. . Patient received an After Visit Summary.  Helane RimaErica Ulrich Soules, DO [place] 08/25/2017  Future Appointments  Date Time Provider Department Center  09/23/2017  7:40 AM [facility] 3 [facility]  11/24/2017  7:40 AM Helane RimaWallace, Koltan Portocarrero, DO [facility]

## 2017-08-25 ENCOUNTER — Encounter: Payer: Self-pay | Admitting: Family Medicine

## 2017-08-25 ENCOUNTER — Ambulatory Visit (INDEPENDENT_AMBULATORY_CARE_PROVIDER_SITE_OTHER): Payer: 59 | Admitting: Family Medicine

## 2017-08-25 ENCOUNTER — Ambulatory Visit: Payer: 59 | Admitting: Family Medicine

## 2017-08-25 DIAGNOSIS — G4700 Insomnia, unspecified: Secondary | ICD-10-CM | POA: Diagnosis not present

## 2017-08-25 DIAGNOSIS — I10 Essential (primary) hypertension: Secondary | ICD-10-CM

## 2017-08-25 DIAGNOSIS — R600 Localized edema: Secondary | ICD-10-CM | POA: Diagnosis not present

## 2017-08-25 MED ORDER — PHENTERMINE HCL 37.5 MG PO TABS: ORAL_TABLET | ORAL | 2 refills | Status: DC

## 2017-08-25 MED ORDER — HYDROCHLOROTHIAZIDE 12.5 MG PO CAPS: 12.5000 mg | ORAL_CAPSULE | Freq: Every day | ORAL | 0 refills | Status: DC

## 2017-08-25 MED ORDER — POTASSIUM CHLORIDE ER 10 MEQ PO TBCR: 10.0000 meq | EXTENDED_RELEASE_TABLET | Freq: Every day | ORAL | 0 refills | Status: DC

## 2017-08-25 MED ORDER — TRAZODONE HCL 50 MG PO TABS: 25.0000 mg | ORAL_TABLET | Freq: Every evening | ORAL | 3 refills | Status: DC | PRN

## 2017-09-22 ENCOUNTER — Ambulatory Visit: Payer: 59

## 2017-09-23 ENCOUNTER — Ambulatory Visit
Admission: RE | Admit: 2017-09-23 | Discharge: 2017-09-23 | Disposition: A | Discharge status: Home / Self Care | Payer: 59 | Source: Ambulatory Visit | Attending: Family Medicine | Admitting: Family Medicine

## 2017-09-23 DIAGNOSIS — Z1231 Encounter for screening mammogram for malignant neoplasm of breast: Secondary | ICD-10-CM

## 2017-09-29 ENCOUNTER — Other Ambulatory Visit: Payer: Self-pay | Admitting: Family Medicine

## 2017-09-29 DIAGNOSIS — R600 Localized edema: Secondary | ICD-10-CM

## 2017-10-14 ENCOUNTER — Other Ambulatory Visit: Payer: Self-pay | Admitting: Family Medicine

## 2017-10-14 DIAGNOSIS — R600 Localized edema: Secondary | ICD-10-CM

## 2017-11-24 ENCOUNTER — Ambulatory Visit: Payer: 59 | Admitting: Family Medicine

## 2018-01-03 ENCOUNTER — Other Ambulatory Visit: Payer: Self-pay | Admitting: Family Medicine

## 2018-01-03 DIAGNOSIS — G4700 Insomnia, unspecified: Secondary | ICD-10-CM

## 2018-01-03 DIAGNOSIS — R600 Localized edema: Secondary | ICD-10-CM

## 2018-01-06 NOTE — Telephone Encounter (Signed)
One month okay with visit made.

## 2018-01-07 NOTE — Telephone Encounter (Signed)
Called pt and left VM to call the office. CRM created.  

## 2018-01-25 ENCOUNTER — Other Ambulatory Visit: Payer: Self-pay | Admitting: Family Medicine

## 2018-01-25 DIAGNOSIS — G4700 Insomnia, unspecified: Secondary | ICD-10-CM

## 2018-01-25 DIAGNOSIS — R600 Localized edema: Secondary | ICD-10-CM

## 2018-02-10 ENCOUNTER — Encounter: Payer: Self-pay | Admitting: Physician Assistant

## 2018-11-15 NOTE — Progress Notes (Deleted)
Subjective:    Bethany Conley is a 48 y.o. female and is here for a comprehensive physical exam.  Health Maintenance Due  Topic Date Due  . TETANUS/TDAP  05/18/1988  . INFLUENZA VACCINE  09/05/2018     Current Outpatient Medications:  .  hydrochlorothiazide (MICROZIDE) 12.5 MG capsule, TAKE 1 CAPSULE BY MOUTH EVERY DAY, Disp: 90 capsule, Rfl: 1 .  phentermine (ADIPEX-P) 37.5 MG tablet, One po in am and one po q afternoon, Disp: 45 tablet, Rfl: 2 .  potassium chloride (K-DUR) 10 MEQ tablet, TAKE 1 TABLET BY MOUTH EVERY DAY, Disp: 90 tablet, Rfl: 1 .  traZODone (DESYREL) 50 MG tablet, TAKE 1/2 TO 1 TABLET BY MOUTH AT BEDTIME AS NEEDED FOR SLEEP, Disp: 90 tablet, Rfl: 0  PMHx, SurgHx, SocialHx, Medications, and Allergies were reviewed in the Visit Navigator and updated as appropriate.   No past medical history on file.  Past Surgical History:  Procedure Laterality Date  . TUBAL LIGATION      Family History  Problem Relation Age of Onset  . Breast cancer Neg Hx     Social History   Tobacco Use  . Smoking status: Never Smoker  . Smokeless tobacco: Never Used  Substance Use Topics  . Alcohol use: No    Frequency: Never  . Drug use: No    Review of Systems:   Pertinent items are noted in the HPI. Otherwise, ROS is negative.  Objective:   There were no vitals taken for this visit.  General appearance: alert, cooperative and appears stated age. Head: normocephalic, without obvious abnormality, atraumatic. Neck: no adenopathy, supple, symmetrical, trachea midline; thyroid not enlarged, symmetric, no tenderness/mass/nodules. Lungs: clear to auscultation bilaterally. Breasts: inspection negative, no nipple retraction or dimpling, no nipple discharge or bleeding, no axillary or supraclavicular adenopathy, normal to palpation without dominant masses. Heart: regular rate and rhythm Abdomen: soft, non-tender; no masses,  no organomegaly. Extremities: extremities normal,  atraumatic, no cyanosis or edema. Skin: skin color, texture, turgor normal, no rashes or lesions. Lymph: cervical, supraclavicular, and axillary nodes normal; no abnormal inguinal nodes palpated. Neurologic: grossly normal.  Pelvic:  External genitalia: no lesions. Urethra: normal appearing urethra with no masses, tenderness or lesions. Bartholin's and Skene's: normal. Vagina: normal appearing vagina with normal color and discharge, no lesions. Cervix: normal appearance. Pap and high risk HPV testing done: {yes no:[id number]} Uterus: uterus is normal size, shape, consistency and nontender. Adnexa: normal adnexa in size, nontender and no masses.                                      Assessment/Plan:   There are no diagnoses linked to this encounter.  Patient Counseling: [x]    Nutrition: Stressed importance of moderation in sodium/caffeine intake, saturated fat and cholesterol, caloric balance, sufficient intake of fresh fruits, vegetables, fiber, calcium, iron, and 1 mg of folate supplement per day (for females capable of pregnancy).  [x]    Stressed the importance of regular exercise.   [x]    Substance Abuse: Discussed cessation/primary prevention of tobacco, alcohol, or other drug use; driving or other dangerous activities under the influence; availability of treatment for abuse.   [x]    Injury prevention: Discussed safety belts, safety helmets, smoke detector, smoking near bedding or upholstery.   [x]    Sexuality: Discussed sexually transmitted diseases, partner selection, use of condoms, avoidance of unintended pregnancy  and contraceptive alternatives.  [x]   Dental health: Discussed importance of regular tooth brushing, flossing, and dental visits.  [x]    Health maintenance and immunizations reviewed. Please refer to Health maintenance section.   Briscoe Deutscher, DO [place]

## 2018-11-16 ENCOUNTER — Encounter: Payer: 59 | Admitting: Family Medicine

## 2019-02-05 HISTORY — PX: COLONOSCOPY WITH PROPOFOL: SHX5780

## 2019-02-26 ENCOUNTER — Other Ambulatory Visit: Payer: Self-pay

## 2019-02-26 ENCOUNTER — Encounter (HOSPITAL_COMMUNITY): Payer: Self-pay | Admitting: Emergency Medicine

## 2019-02-26 ENCOUNTER — Emergency Department (HOSPITAL_COMMUNITY): Payer: 59

## 2019-02-26 ENCOUNTER — Emergency Department (HOSPITAL_COMMUNITY)
Admission: EM | Admit: 2019-02-26 | Discharge: 2019-02-27 | Disposition: A | Discharge status: Home / Self Care | Payer: 59 | Attending: Emergency Medicine | Admitting: Emergency Medicine

## 2019-02-26 DIAGNOSIS — I10 Essential (primary) hypertension: Secondary | ICD-10-CM | POA: Insufficient documentation

## 2019-02-26 DIAGNOSIS — R079 Chest pain, unspecified: Secondary | ICD-10-CM | POA: Diagnosis not present

## 2019-02-26 DIAGNOSIS — M25512 Pain in left shoulder: Secondary | ICD-10-CM | POA: Insufficient documentation

## 2019-02-26 DIAGNOSIS — Z79899 Other long term (current) drug therapy: Secondary | ICD-10-CM | POA: Insufficient documentation

## 2019-02-26 LAB — PROTIME-INR
INR: 1 (ref 0.8–1.2)
Prothrombin Time: 13.1 seconds (ref 11.4–15.2)

## 2019-02-26 LAB — CBC
HCT: 33.8 % — ABNORMAL LOW (ref 36.0–46.0)
Hemoglobin: 10.5 g/dL — ABNORMAL LOW (ref 12.0–15.0)
MCH: 28.9 pg (ref 26.0–34.0)
MCHC: 31.1 g/dL (ref 30.0–36.0)
MCV: 93.1 fL (ref 80.0–100.0)
Platelets: 393 10*3/uL (ref 150–400)
RBC: 3.63 MIL/uL — ABNORMAL LOW (ref 3.87–5.11)
RDW: 13 % (ref 11.5–15.5)
WBC: 12.7 10*3/uL — ABNORMAL HIGH (ref 4.0–10.5)
nRBC: 0 % (ref 0.0–0.2)

## 2019-02-26 LAB — BASIC METABOLIC PANEL
Anion gap: 9 (ref 5–15)
BUN: 8 mg/dL (ref 6–20)
CO2: 24 mmol/L (ref 22–32)
Calcium: 8.8 mg/dL — ABNORMAL LOW (ref 8.9–10.3)
Chloride: 104 mmol/L (ref 98–111)
Creatinine, Ser: 0.74 mg/dL (ref 0.44–1.00)
GFR calc Af Amer: >60 mL/min (ref >60)
GFR calc non Af Amer: >60 mL/min (ref >60)
Glucose, Bld: 76 mg/dL (ref 70–99)
Potassium: 3.7 mmol/L (ref 3.5–5.1)
Sodium: 137 mmol/L (ref 135–145)

## 2019-02-26 LAB — TROPONIN I (HIGH SENSITIVITY): Troponin I (High Sensitivity): <2 ng/L (ref <18)

## 2019-02-26 LAB — I-STAT BETA HCG BLOOD, ED (MC, WL, AP ONLY): I-stat hCG, quantitative: <5 m[IU]/mL (ref <5)

## 2019-02-26 MED ORDER — KETOROLAC TROMETHAMINE 60 MG/2ML IM SOLN
30.0000 mg | Freq: Once | INTRAMUSCULAR | Status: AC
  Administered 2019-02-27: 30 mg via INTRAMUSCULAR
  Filled 2019-02-26: qty 2

## 2019-02-26 MED ORDER — SODIUM CHLORIDE 0.9% FLUSH: 3.0000 mL | Freq: Once | INTRAVENOUS | Status: DC

## 2019-02-26 MED ORDER — LIDOCAINE 5 % EX PTCH
1.0000 | MEDICATED_PATCH | CUTANEOUS | Status: DC
  Administered 2019-02-27: 1 via TRANSDERMAL
  Filled 2019-02-26: qty 1

## 2019-02-26 NOTE — ED Provider Notes (Signed)
Emergency Department Provider Note   I have reviewed the triage vital signs and the nursing notes.   HISTORY  Chief Complaint Chest Pain   HPI Bethany Conley is a 49 y.o. female who presents the emergency department today with left chest and shoulder pain.  Patient states this started 3 to 4 days ago.  There is no inciting incident.  She states that hurts worse with motion and lifting things straight forward.  No cough, fever, nausea, vomiting, shortness of breath, lightheadedness or syncope.  She never had that this before.  She works at the [employer] and does lift things but she is worked there for 28 years and never had this problem.  She states that it does not seem to go down into her chest but mostly in her left upper chest and left shoulder.  Hurts worse when she pushes on it.  She initially got some relief with aspirin and ibuprofen but then today she started having some left anterior leg pain to which concerned her and brought her in for evaluation.  This pain is gone now.  It does not change with exertion.  Does not change with active range of motion.   No other associated or modifying symptoms.    History reviewed. No pertinent past medical history.  Patient Active Problem List   Diagnosis Date Noted  . Morbid obesity (HCC) 04/10/2016  . Arthralgia of left knee 04/10/2016  . Arthralgia of left ankle 04/10/2016  . Iron deficiency anemia due to chronic blood loss 04/10/2016  . Menorrhagia with regular cycle 04/10/2016  . Essential hypertension 04/10/2016    Past Surgical History:  Procedure Laterality Date  . TUBAL LIGATION      Current Outpatient Rx  . Order #: [id number] Class: Normal  . Order #: [id number] Class: Print  . Order #: [id number] Class: Normal  . Order #: [id number] Class: Normal    Allergies Sulfa antibiotics  Family History  Problem Relation Age of Onset  . Breast cancer Neg Hx     Social History Social History   Tobacco Use  . Smoking  status: Never Smoker  . Smokeless tobacco: Never Used  Substance Use Topics  . Alcohol use: No  . Drug use: No    Review of Systems  All other systems negative except as documented in the HPI. All pertinent positives and negatives as reviewed in the HPI. ____________________________________________   PHYSICAL EXAM:  VITAL SIGNS: ED Triage Vitals  Enc Vitals Group     BP 02/26/19 2018 (!) 168/99     Pulse Rate 02/26/19 2018 90     Resp 02/26/19 2018 20     Temp 02/26/19 2018 98.6 F (37 C)     Temp Source 02/26/19 2018 Oral     SpO2 02/26/19 2018 100 %    Constitutional: Alert and oriented. Well appearing and in no acute distress. Eyes: Conjunctivae are normal. PERRL. EOMI. Head: Atraumatic. Nose: No congestion/rhinnorhea. Mouth/Throat: Mucous membranes are moist.  Oropharynx non-erythematous. Neck: No stridor.  No meningeal signs.   Cardiovascular: Normal rate, regular rhythm. Good peripheral circulation. Grossly normal heart sounds.   Respiratory: Normal respiratory effort.  No retractions. Lungs CTAB. Gastrointestinal: Soft and nontender. No distention.  Musculoskeletal: Pain with palpation around the pectoral insertion of the left shoulder.  Also has pain with active range of motion of her left arm.  No pain with passive range of motion.  Normal strength. Neurologic:  No altered mental status, able to give full seemingly accurate  history.  Face is symmetric, EOM's intact, pupils equal and reactive, vision intact, tongue and uvula midline without deviation. Upper and Lower extremity motor 5/5, intact pain perception in distal extremities, 2+ reflexes in biceps, patella and achilles tendons. Able to perform finger to nose normal with both hands. Walks without assistance or evident ataxia.  Skin:  Skin is warm, dry and intact. No rash noted.   ____________________________________________   LABS (all labs ordered are listed, but only abnormal results are  displayed)  Labs Reviewed  BASIC METABOLIC PANEL - Abnormal; Notable for the following components:      Result Value   Calcium 8.8 (*)    All other components within normal limits  CBC - Abnormal; Notable for the following components:   WBC 12.7 (*)    RBC 3.63 (*)    Hemoglobin 10.5 (*)    HCT 33.8 (*)    All other components within normal limits  PROTIME-INR  I-STAT BETA HCG BLOOD, ED ([facility], [facility], [facility] ONLY)  TROPONIN I (HIGH SENSITIVITY)  TROPONIN I (HIGH SENSITIVITY)   ____________________________________________  EKG   EKG Interpretation  Date/Time:  Friday February 26 2019 20:19:55 EST Ventricular Rate:  89 PR Interval:  166 QRS Duration: 82 QT Interval:  366 QTC Calculation: 445 R Axis:   11 Text Interpretation: Normal sinus rhythm Normal ECG No previous tracing Confirmed by Lajean Saver [phone]) on 02/26/2019 9:23:14 PM       ____________________________________________  RADIOLOGY  DG Chest 2 View  Result Date: 02/26/2019 CLINICAL DATA:  Chest pain. Mild shortness of breath. EXAM: CHEST - 2 VIEW COMPARISON:  Radiograph 05/25/2015 FINDINGS: The cardiomediastinal contours are normal. Mild bronchial thickening. Pulmonary vasculature is normal. No consolidation, pleural effusion, or pneumothorax. No acute osseous abnormalities are seen. IMPRESSION: Mild bronchial thickening, possible bronchitis. Electronically Signed   By: Keith Rake M.D.   On: 02/26/2019 21:42    ____________________________________________   PROCEDURES  Procedure(s) performed:   Procedures   ____________________________________________   INITIAL IMPRESSION / ASSESSMENT AND PLAN / ED COURSE  Suspect patient likely has musculoskeletal left shoulder pain.  She does have high blood pressure but this could be related to the pain, situation or could be chronic.  I do not think this is hypertensive crisis is very troponin is normal and her EKG is normal.  Offered multiple suggestions for  relief of musculoskeletal pain and PCP follow-up.     Pertinent labs & imaging results that were available during my care of the patient were reviewed by me and considered in my medical decision making (see chart for details).  A medical screening exam was performed and I feel the patient has had an appropriate workup for their chief complaint at this time and likelihood of emergent condition existing is low. They have been counseled on decision, discharge, follow up and which symptoms necessitate immediate return to the emergency department. They or their family verbally stated understanding and agreement with plan and discharged in stable condition.   ____________________________________________  FINAL CLINICAL IMPRESSION(S) / ED DIAGNOSES  Final diagnoses:  Acute pain of left shoulder  Single episode of hypertension     MEDICATIONS GIVEN DURING THIS VISIT:  Medications  sodium chloride flush (NS) 0.9 % injection 3 mL (3 mLs Intravenous Not Given 02/27/19 0039)  lidocaine (LIDODERM) 5 % 1 patch (1 patch Transdermal Patch Applied 02/27/19 0035)  ketorolac (TORADOL) injection 30 mg (30 mg Intramuscular Given 02/27/19 0034)     NEW OUTPATIENT MEDICATIONS STARTED DURING THIS  VISIT:  New Prescriptions   No medications on file    Note:  This note was prepared with assistance of Dragon voice recognition software. Occasional wrong-word or sound-a-like substitutions may have occurred due to the inherent limitations of voice recognition software.   Marily Memos, MD 02/27/19 [phone]

## 2019-02-26 NOTE — ED Triage Notes (Signed)
Patient reports left chest pain radiating to left arm onset this week with mild SOB , denies emesis or diaphoresis , no fever or cough . Patient added left leg pain today denies injury/ambulatory .

## 2019-02-27 LAB — TROPONIN I (HIGH SENSITIVITY): Troponin I (High Sensitivity): 3 ng/L (ref <18)

## 2019-02-27 NOTE — Discharge Instructions (Signed)
Your blood pressure readings were elevated as below. Please follow up with your doctor for recheck.   Vitals:   02/26/19 2018 02/27/19 0034 02/27/19 0100  BP: (!) 168/99 (!) 161/97 (!) 158/92  Pulse: 90 73 72  Resp: 20    Temp: 98.6 F (37 C)    TempSrc: Oral    SpO2: 100% 100% 98%

## 2019-02-28 ENCOUNTER — Encounter (HOSPITAL_COMMUNITY): Payer: Self-pay | Admitting: Emergency Medicine

## 2019-02-28 ENCOUNTER — Emergency Department (HOSPITAL_COMMUNITY)
Admission: EM | Admit: 2019-02-28 | Discharge: 2019-02-28 | Disposition: A | Discharge status: Home / Self Care | Payer: 59 | Attending: Emergency Medicine | Admitting: Emergency Medicine

## 2019-02-28 ENCOUNTER — Other Ambulatory Visit: Payer: Self-pay

## 2019-02-28 DIAGNOSIS — Z760 Encounter for issue of repeat prescription: Secondary | ICD-10-CM

## 2019-02-28 DIAGNOSIS — R030 Elevated blood-pressure reading, without diagnosis of hypertension: Secondary | ICD-10-CM

## 2019-02-28 DIAGNOSIS — I10 Essential (primary) hypertension: Secondary | ICD-10-CM | POA: Diagnosis not present

## 2019-02-28 MED ORDER — HYDROCHLOROTHIAZIDE 12.5 MG PO CAPS
12.5000 mg | ORAL_CAPSULE | Freq: Every day | ORAL | Status: DC
  Administered 2019-02-28: 12.5 mg via ORAL
  Filled 2019-02-28: qty 1

## 2019-02-28 MED ORDER — HYDROCHLOROTHIAZIDE 12.5 MG PO TABS: 12.5000 mg | ORAL_TABLET | Freq: Every day | ORAL | 0 refills | Status: DC

## 2019-02-28 NOTE — Discharge Instructions (Addendum)
As discussed, I am sending you home with 30 days worth of your blood pressure medication. You were given one dose here in the ED, so do not take another one until tomorrow. Call around to find a PCP that is accepting new patients. I have included the number for [facility]. You may also call them to schedule an appointment. Return to the ER for new or worsening symptoms.

## 2019-02-28 NOTE — ED Triage Notes (Signed)
Pt states she was seen in ED on Friday for chest pain and all test were normal except BP elevated.  States she was instructed to follow-up with PCP.  Has been off her meds for awhile.  She looked on MyChart and she had received a letter that her PCP had left the practice.  She contacted [facility] and Rx for BP meds expired 1 month ago.  Pt requesting Rx for BP medication until she can get a new PCP.

## 2019-02-28 NOTE — ED Provider Notes (Signed)
[facility] EMERGENCY DEPARTMENT Provider Note   CSN: [id number] Arrival date & time: 02/28/19  1336     History Chief Complaint  Patient presents with  . Needs BP Rx    Bethany Conley is a 49 y.o. female with a past medical history significant for morbid obesity and hypertension who presents to the ED requesting a refill for her blood pressure medication.  Chart reviewed.  Patient was seen in the ED on 02/27/2019 due to left-sided chest pain and shoulder pain with reassuring work-up.  She was found to have elevated blood pressure but no signs of hypertensive urgency/emergency.  Patient attempted to call PCP to refill blood pressure medication, but PCP had left the office.  Patient notes she checked her blood pressure this morning prior to arrival which was 171/101.  Patient denies headache, chest pain, and visual changes. Patient has no complaints at this time.        History reviewed. No pertinent past medical history.  Patient Active Problem List   Diagnosis Date Noted  . Morbid obesity (HCC) 04/10/2016  . Arthralgia of left knee 04/10/2016  . Arthralgia of left ankle 04/10/2016  . Iron deficiency anemia due to chronic blood loss 04/10/2016  . Menorrhagia with regular cycle 04/10/2016  . Essential hypertension 04/10/2016    Past Surgical History:  Procedure Laterality Date  . TUBAL LIGATION       OB History   No obstetric history on file.     Family History  Problem Relation Age of Onset  . Breast cancer Neg Hx     Social History   Tobacco Use  . Smoking status: Never Smoker  . Smokeless tobacco: Never Used  Substance Use Topics  . Alcohol use: No  . Drug use: No    Home Medications Prior to Admission medications   Medication Sig Start Date End Date Taking? Authorizing Provider  hydrochlorothiazide (HYDRODIURIL) 12.5 MG tablet Take 1 tablet (12.5 mg total) by mouth daily. 02/28/19 03/30/19  Mannie Stabile, PA-C    hydrochlorothiazide (MICROZIDE) 12.5 MG capsule TAKE 1 CAPSULE BY MOUTH EVERY DAY Patient not taking: Reported on 02/27/2019 01/26/18   Helane Rima, DO  phentermine (ADIPEX-P) 37.5 MG tablet One po in am and one po q afternoon Patient not taking: Reported on 02/27/2019 08/25/17   Helane Rima, DO  potassium chloride (K-DUR) 10 MEQ tablet TAKE 1 TABLET BY MOUTH EVERY DAY Patient not taking: Reported on 02/27/2019 10/15/17   Helane Rima, DO  traZODone (DESYREL) 50 MG tablet TAKE 1/2 TO 1 TABLET BY MOUTH AT BEDTIME AS NEEDED FOR SLEEP Patient not taking: Reported on 02/27/2019 01/26/18   Helane Rima, DO    Allergies    Sulfa antibiotics  Review of Systems   Review of Systems  Constitutional: Negative for chills and fever.  Eyes: Negative for visual disturbance.  Cardiovascular: Negative for chest pain, palpitations and leg swelling.  Neurological: Negative for dizziness, numbness and headaches.    Physical Exam Updated Vital Signs BP (!) 147/88 (BP Location: Left Arm)   Pulse 85   Temp 99.3 F (37.4 C) (Oral)   Resp 16   LMP 02/22/2019   SpO2 100%   Physical Exam Vitals and nursing note reviewed.  Constitutional:      General: She is not in acute distress.    Appearance: She is not ill-appearing.  HENT:     Head: Normocephalic.  Eyes:     Pupils: Pupils are equal, round, and reactive  to light.  Cardiovascular:     Rate and Rhythm: Normal rate and regular rhythm.     Pulses: Normal pulses.     Heart sounds: Normal heart sounds. No murmur. No friction rub. No gallop.   Pulmonary:     Effort: Pulmonary effort is normal.     Breath sounds: Normal breath sounds.  Abdominal:     General: Abdomen is flat. There is no distension.     Palpations: Abdomen is soft.     Tenderness: There is no abdominal tenderness. There is no guarding or rebound.  Musculoskeletal:     Cervical back: Neck supple.     Comments: Able to move all 4 extremities without difficulty.   Skin:     General: Skin is warm and dry.  Neurological:     General: No focal deficit present.     Mental Status: She is alert.  Psychiatric:        Mood and Affect: Mood normal.        Behavior: Behavior normal.     ED Results / Procedures / Treatments   Labs (all labs ordered are listed, but only abnormal results are displayed) Labs Reviewed - No data to display  EKG None  Radiology DG Chest 2 View  Result Date: 02/26/2019 CLINICAL DATA:  Chest pain. Mild shortness of breath. EXAM: CHEST - 2 VIEW COMPARISON:  Radiograph 05/25/2015 FINDINGS: The cardiomediastinal contours are normal. Mild bronchial thickening. Pulmonary vasculature is normal. No consolidation, pleural effusion, or pneumothorax. No acute osseous abnormalities are seen. IMPRESSION: Mild bronchial thickening, possible bronchitis. Electronically Signed   By: Keith Rake M.D.   On: 02/26/2019 21:42    Procedures Procedures (including critical care time)  Medications Ordered in ED Medications  hydrochlorothiazide (MICROZIDE) capsule 12.5 mg (has no administration in time range)    ED Course  I have reviewed the triage vital signs and the nursing notes.  Pertinent labs & imaging results that were available during my care of the patient were reviewed by me and considered in my medical decision making (see chart for details).    MDM Rules/Calculators/A&P                     49 year old female presents to the ED requesting a refill on her blood pressure medication.  Patient has previously been prescribed hydrochlorothiazide 12.5 mg p.o. daily.  BP today in the ED was 147/88.  Patient denies headache, visual changes, numbness/tingling, and chest pain.  Patient has no other complaints at this time. No concern for hypertensive urgency/emergency. Patient in no acute distress and non-ill-appearing.  Will give 1 dose of hydrochlorothiazide here in the ED and sent home with 30 days worth.  Patient advised to find a PCP in the  area for further evaluation of blood pressure and to continue blood pressure management.  [facility] number given to patient at discharge. Strict ED precautions discussed with patient. Patient states understanding and agrees to plan. Patient discharged home in no acute distress and stable vitals  Final Clinical Impression(s) / ED Diagnoses Final diagnoses:  Elevated blood pressure reading  Medication refill    Rx / DC Orders ED Discharge Orders         Ordered    hydrochlorothiazide (HYDRODIURIL) 12.5 MG tablet  Daily     02/28/19 1523           Karie Kirks 02/28/19 1525    Isla Pence, MD 02/28/19 1601

## 2019-03-08 ENCOUNTER — Ambulatory Visit (INDEPENDENT_AMBULATORY_CARE_PROVIDER_SITE_OTHER): Payer: 59 | Admitting: Family Medicine

## 2019-03-08 ENCOUNTER — Encounter: Payer: Self-pay | Admitting: Family Medicine

## 2019-03-08 DIAGNOSIS — I10 Essential (primary) hypertension: Secondary | ICD-10-CM | POA: Diagnosis not present

## 2019-03-08 DIAGNOSIS — D500 Iron deficiency anemia secondary to blood loss (chronic): Secondary | ICD-10-CM

## 2019-03-08 MED ORDER — HYDROCHLOROTHIAZIDE 12.5 MG PO TABS: 12.5000 mg | ORAL_TABLET | Freq: Every day | ORAL | 3 refills | Status: DC

## 2019-03-08 NOTE — Assessment & Plan Note (Signed)
Much improved though still above goal per JNC 8.  We will continue HCTZ 12.5 mg daily.  Continue home monitoring goal 140/90 or lower.  Will follow up with me in a few months.

## 2019-03-08 NOTE — Assessment & Plan Note (Addendum)
Continue iron supplementation.  Will check CBC and iron panel next blood draw.

## 2019-03-08 NOTE — Progress Notes (Signed)
   Bethany Conley is a 49 y.o. female who presents today for a virtual office visit.  Assessment/Plan:  Chronic Problems Addressed Today: Essential hypertension Much improved though still above goal per JNC 8.  We will continue HCTZ 12.5 mg daily.  Continue home monitoring goal 140/90 or lower.  Will follow up with me in a few months.  Iron deficiency anemia due to chronic blood loss Continue iron supplementation.  Will check CBC and iron panel next blood draw.     Subjective:  HPI:   Patient was seen in the emergency room about a week ago for elevated blood pressure readings.  She had self discontinued her HCTZ.  At that time she was also having other symptoms including dizziness, headache, chest pain, and shortness of breath.  She had full work-up that time including blood work and chest x-ray all of which were normal.  She was restarted on HCTZ 12.5 mg daily.  She has done well since restarting her medication.  Symptoms have subsided.  Blood pressure this morning was 130s over 90s.       Objective/Observations  Physical Exam: Gen: NAD, resting comfortably Pulm: Normal work of breathing Neuro: Grossly normal, moves all extremities Psych: Normal affect and thought content  Virtual Visit via Video   I connected with Bethany Conley on 03/08/19 at 11:00 AM EST by a video enabled telemedicine application and verified that I am speaking with the correct person using two identifiers. The limitations of evaluation and management by telemedicine and the availability of in person appointments were discussed. The patient expressed understanding and agreed to proceed.   Patient location: Home Provider location: [place] [employer] Persons participating in the virtual visit: Myself and Patient     Katina Degree. Jimmey Ralph, MD 03/08/2019 11:50 AM

## 2019-04-29 ENCOUNTER — Ambulatory Visit: Payer: 59 | Attending: Internal Medicine

## 2019-04-29 DIAGNOSIS — Z23 Encounter for immunization: Secondary | ICD-10-CM

## 2019-04-29 NOTE — Progress Notes (Signed)
   Covid-19 Vaccination Clinic  Name:  [name]    MRN: [id number] DOB: Jun 08, 1970  04/29/2019  [name] was observed post Covid-19 immunization for 15 minutes without incident. She was provided with Vaccine Information Sheet and instruction to access the V-Safe system.   [name] was instructed to call 911 with any severe reactions post vaccine: Marland Kitchen Difficulty breathing  . Swelling of face and throat  . A fast heartbeat  . A bad rash all over body  . Dizziness and weakness   Immunizations Administered    Name Date Dose VIS Date Route   Pfizer COVID-19 Vaccine 04/29/2019  4:19 PM 0.3 mL 01/15/2019 Intramuscular   Manufacturer: [employer], [employer]   Lot: [id number]   NDC: [address]-8

## 2019-05-18 ENCOUNTER — Encounter: Payer: 59 | Admitting: Family Medicine

## 2019-05-24 ENCOUNTER — Ambulatory Visit: Payer: 59 | Attending: Internal Medicine

## 2019-05-24 DIAGNOSIS — Z23 Encounter for immunization: Secondary | ICD-10-CM

## 2019-05-24 NOTE — Progress Notes (Signed)
   Covid-19 Vaccination Clinic  Name:  ELIZE PINON    MRN: [id number] DOB: 06/06/1970  05/24/2019  Ms. Profit was observed post Covid-19 immunization for 15 minutes without incident. She was provided with Vaccine Information Sheet and instruction to access the V-Safe system.   Ms. Hinchey was instructed to call 911 with any severe reactions post vaccine: Marland Kitchen Difficulty breathing  . Swelling of face and throat  . A fast heartbeat  . A bad rash all over body  . Dizziness and weakness   Immunizations Administered    Name Date Dose VIS Date Route   Pfizer COVID-19 Vaccine 05/24/2019  4:05 PM 0.3 mL 03/31/2018 Intramuscular   Manufacturer: [employer], [employer]   Lot: [id number]   NDC: [address]-7

## 2019-06-21 LAB — HM COLONOSCOPY

## 2019-07-07 ENCOUNTER — Other Ambulatory Visit: Payer: Self-pay | Admitting: Family Medicine

## 2019-07-07 DIAGNOSIS — Z1231 Encounter for screening mammogram for malignant neoplasm of breast: Secondary | ICD-10-CM

## 2019-07-21 ENCOUNTER — Ambulatory Visit
Admission: RE | Admit: 2019-07-21 | Discharge: 2019-07-21 | Disposition: A | Discharge status: Home / Self Care | Payer: 59 | Source: Ambulatory Visit | Attending: Family Medicine | Admitting: Family Medicine

## 2019-07-21 ENCOUNTER — Other Ambulatory Visit: Payer: Self-pay

## 2019-07-21 DIAGNOSIS — Z1231 Encounter for screening mammogram for malignant neoplasm of breast: Secondary | ICD-10-CM

## 2019-07-23 ENCOUNTER — Other Ambulatory Visit: Payer: Self-pay | Admitting: Family Medicine

## 2019-07-23 DIAGNOSIS — R928 Other abnormal and inconclusive findings on diagnostic imaging of breast: Secondary | ICD-10-CM

## 2019-08-05 ENCOUNTER — Ambulatory Visit
Admission: RE | Admit: 2019-08-05 | Discharge: 2019-08-05 | Disposition: A | Discharge status: Home / Self Care | Payer: 59 | Source: Ambulatory Visit | Attending: Family Medicine | Admitting: Family Medicine

## 2019-08-05 ENCOUNTER — Other Ambulatory Visit: Payer: Self-pay

## 2019-08-05 ENCOUNTER — Ambulatory Visit: Payer: 59

## 2019-08-05 DIAGNOSIS — R928 Other abnormal and inconclusive findings on diagnostic imaging of breast: Secondary | ICD-10-CM

## 2020-03-21 ENCOUNTER — Other Ambulatory Visit: Payer: Self-pay | Admitting: Family Medicine

## 2020-08-31 IMAGING — MG MM DIGITAL DIAGNOSTIC UNILAT*R* W/ TOMO W/ CAD
4 series · 4 of 12 positions shown · non-contrast
Comparison: Previous exams.

CLINICAL DATA: Screening recall for possible right breast
asymmetry.

EXAM:
DIGITAL DIAGNOSTIC UNILATERAL RIGHT MAMMOGRAM WITH TOMO AND CAD

[R MLO synth-2D]
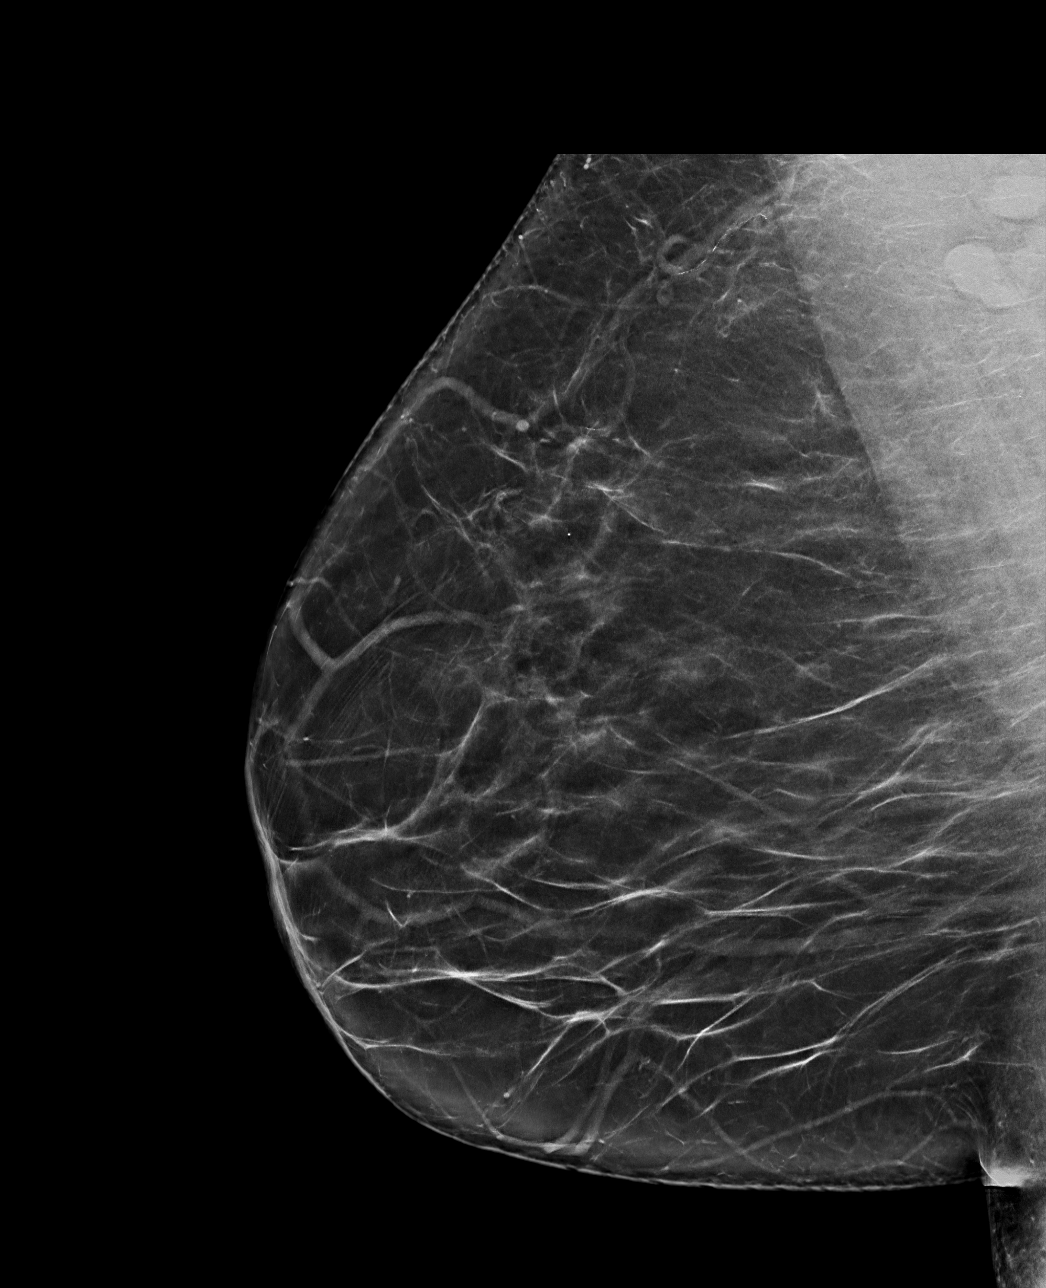

[R CC synth-2D]
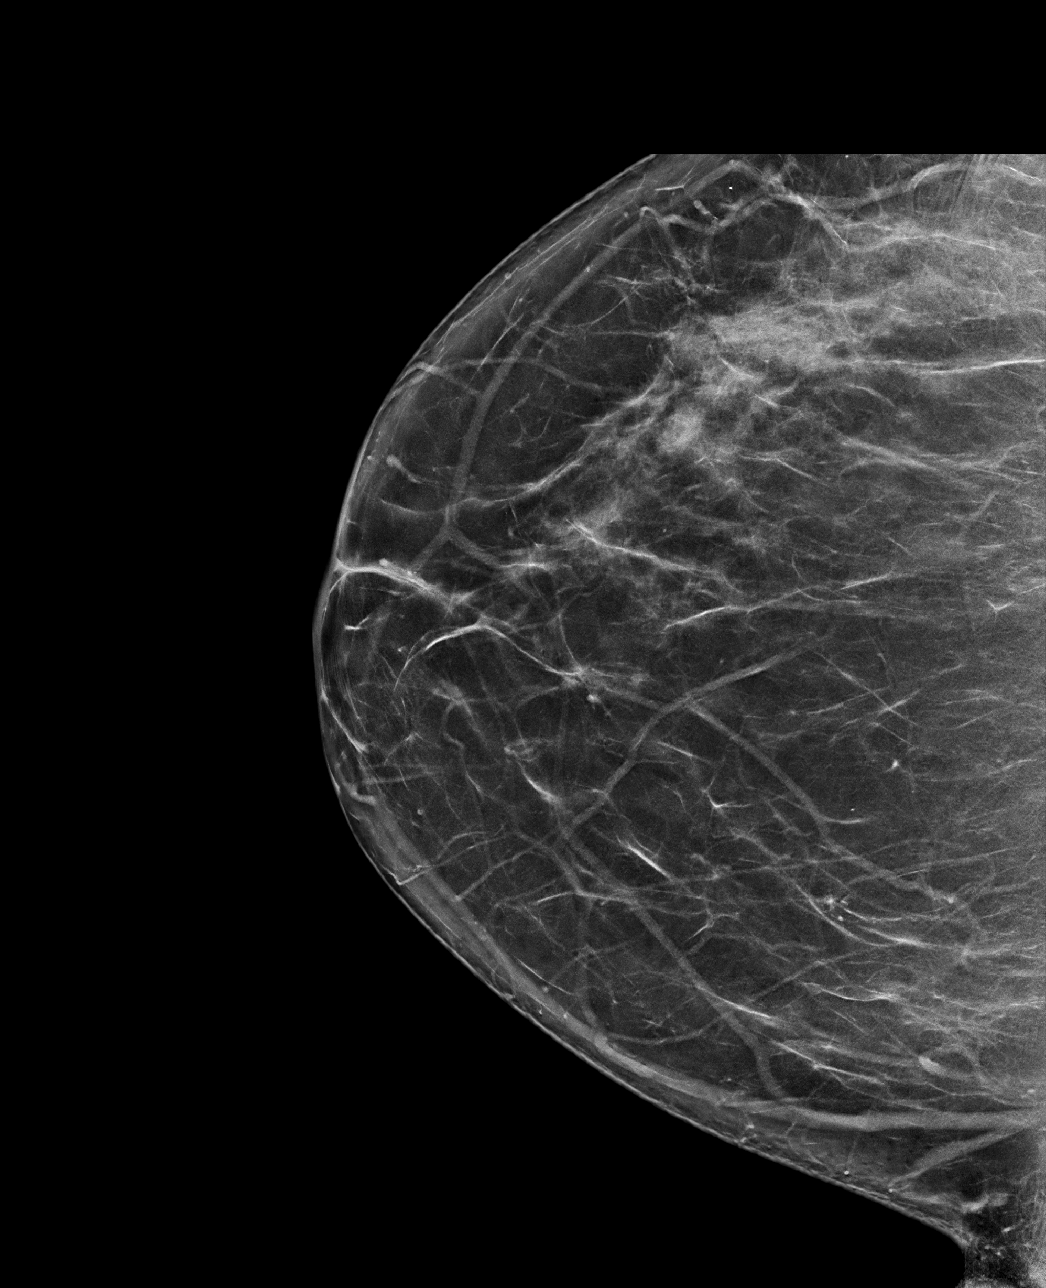

[R CC tomo · tomo slice 41/80.0]
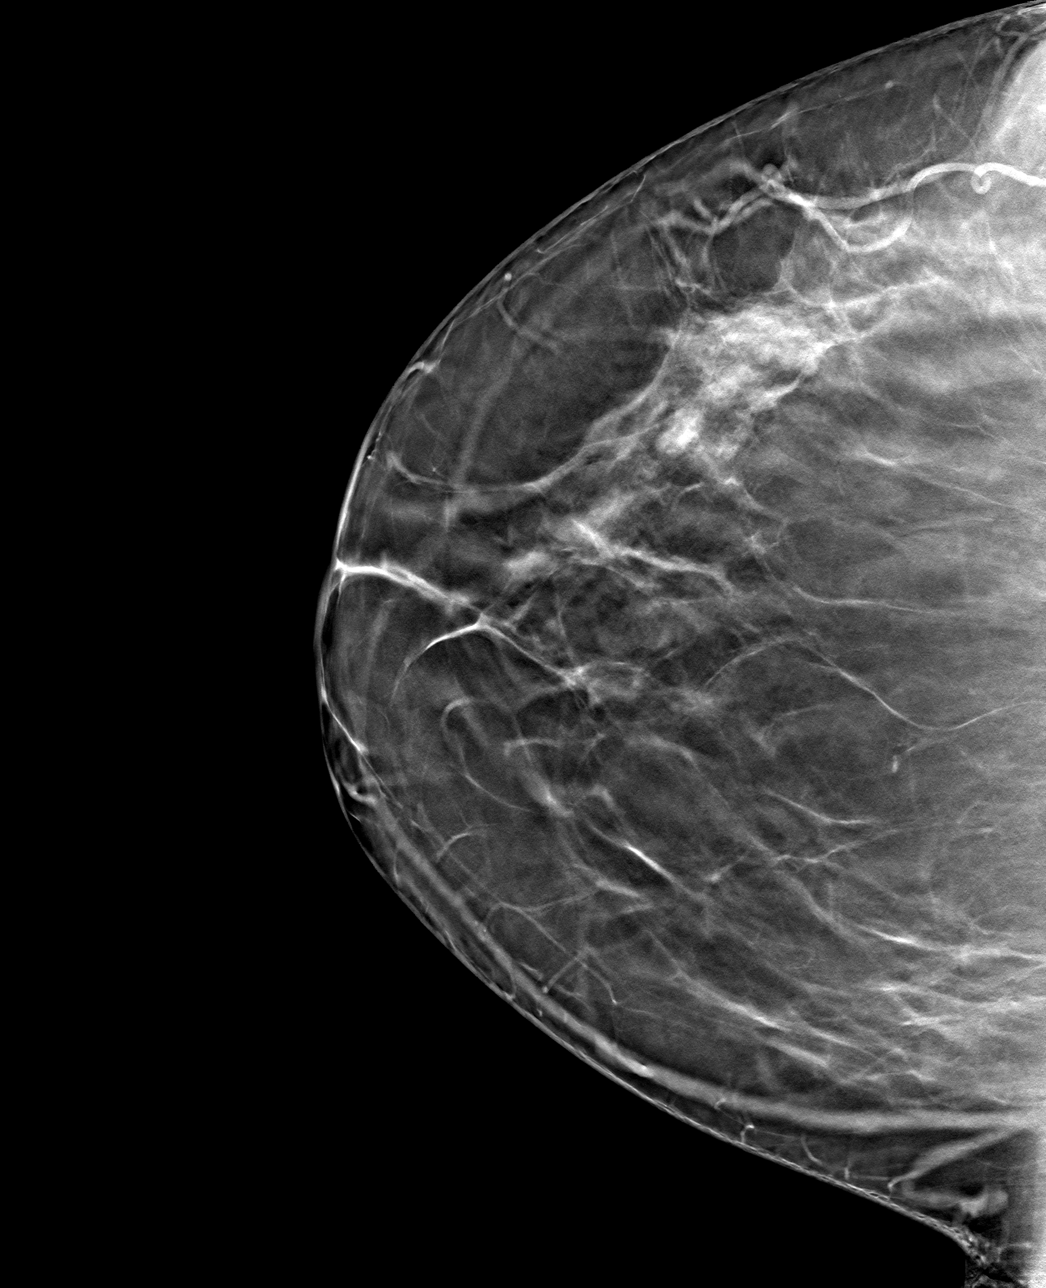

[R MLO tomo · tomo slice 47/92.0]
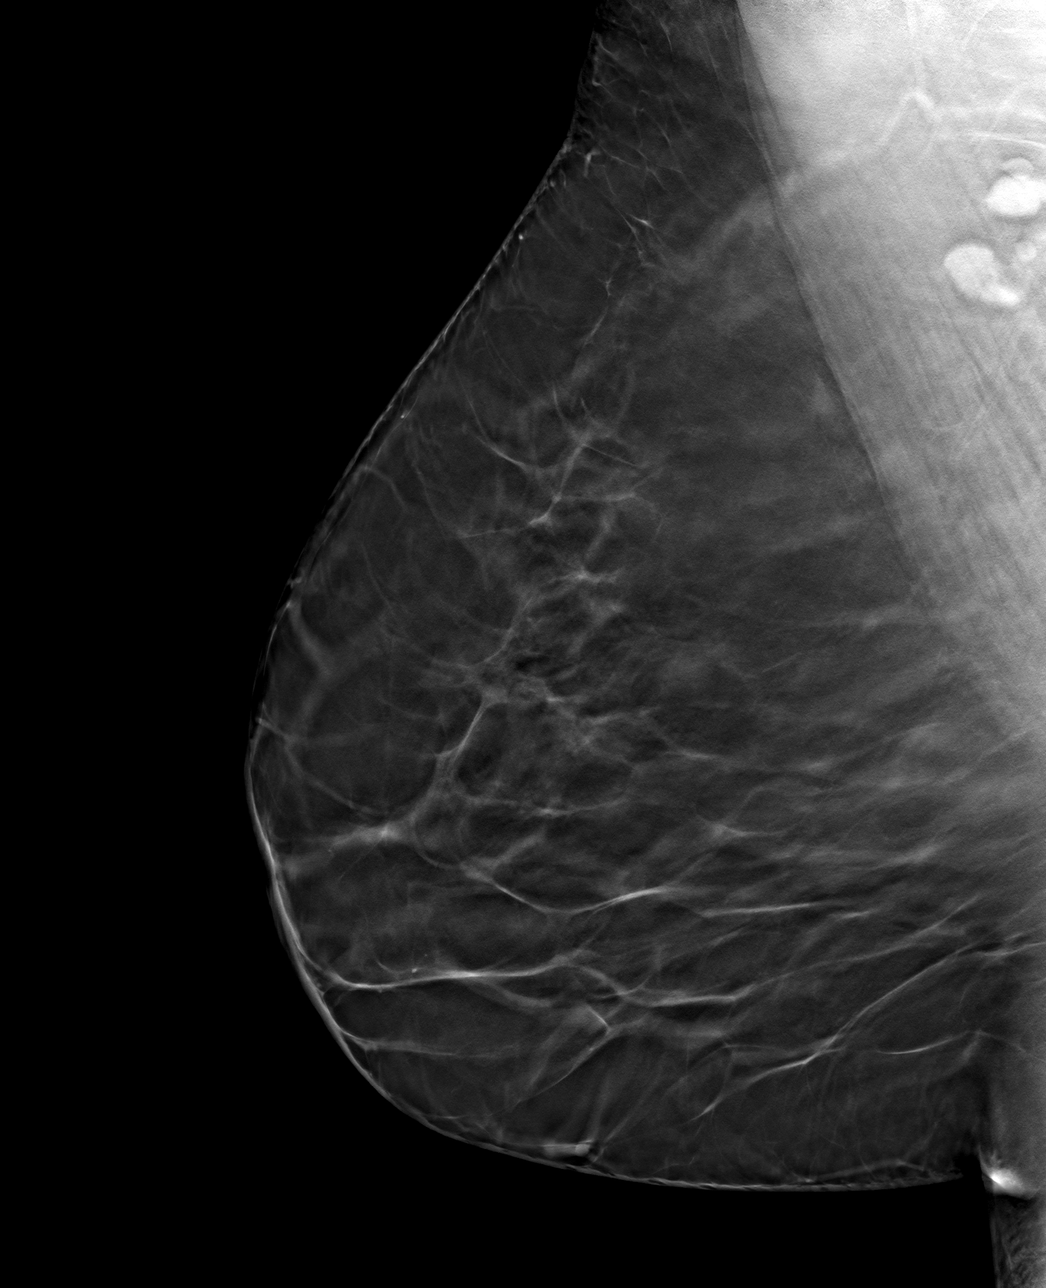

[4 of 12 positions shown; findings below may reference images not displayed]

ACR Breast Density Category b: There are scattered areas of
fibroglandular density.
FINDINGS: Cc and MLO tomograms were performed of the right breast. The
initially questioned possible right breast asymmetry resolves with
the fibroglandular pattern stable in appearance. There is no
mammographic evidence of malignancy in the right breast.

Mammographic images were processed with CAD.
IMPRESSION: No mammographic evidence of malignancy in the right breast.

RECOMMENDATION:
Screening mammogram in one year.(Code:[id number])

I have discussed the findings and recommendations with the patient.
If applicable, a reminder letter will be sent to the patient
regarding the next appointment.

BI-RADS CATEGORY  1: Negative.

## 2020-09-04 ENCOUNTER — Other Ambulatory Visit: Payer: Self-pay | Admitting: Family Medicine

## 2020-09-04 DIAGNOSIS — Z1231 Encounter for screening mammogram for malignant neoplasm of breast: Secondary | ICD-10-CM

## 2020-09-11 ENCOUNTER — Ambulatory Visit: Payer: 59

## 2020-10-30 ENCOUNTER — Other Ambulatory Visit: Payer: Self-pay

## 2020-10-30 ENCOUNTER — Ambulatory Visit
Admission: RE | Admit: 2020-10-30 | Discharge: 2020-10-30 | Disposition: A | Discharge status: Home / Self Care | Payer: 59 | Source: Ambulatory Visit | Attending: Family Medicine | Admitting: Family Medicine

## 2020-10-30 DIAGNOSIS — Z1231 Encounter for screening mammogram for malignant neoplasm of breast: Secondary | ICD-10-CM

## 2022-02-26 ENCOUNTER — Other Ambulatory Visit: Payer: Self-pay

## 2022-02-26 ENCOUNTER — Encounter (HOSPITAL_COMMUNITY): Payer: Self-pay | Admitting: Emergency Medicine

## 2022-02-26 ENCOUNTER — Emergency Department (HOSPITAL_COMMUNITY): Payer: 59

## 2022-02-26 ENCOUNTER — Emergency Department (HOSPITAL_COMMUNITY)
Admission: EM | Admit: 2022-02-26 | Discharge: 2022-02-26 | Disposition: A | Discharge status: Home / Self Care | Payer: 59 | Attending: Emergency Medicine | Admitting: Emergency Medicine

## 2022-02-26 DIAGNOSIS — R319 Hematuria, unspecified: Secondary | ICD-10-CM | POA: Diagnosis not present

## 2022-02-26 DIAGNOSIS — R42 Dizziness and giddiness: Secondary | ICD-10-CM | POA: Diagnosis not present

## 2022-02-26 DIAGNOSIS — R519 Headache, unspecified: Secondary | ICD-10-CM | POA: Diagnosis present

## 2022-02-26 DIAGNOSIS — I10 Essential (primary) hypertension: Secondary | ICD-10-CM | POA: Diagnosis not present

## 2022-02-26 DIAGNOSIS — Z79899 Other long term (current) drug therapy: Secondary | ICD-10-CM | POA: Insufficient documentation

## 2022-02-26 LAB — URINALYSIS, ROUTINE W REFLEX MICROSCOPIC
Bilirubin Urine: NEGATIVE
Glucose, UA: NEGATIVE mg/dL
Ketones, ur: NEGATIVE mg/dL
Nitrite: NEGATIVE
Protein, ur: 100 mg/dL — AB
Specific Gravity, Urine: 1.006 (ref 1.005–1.030)
pH: 5 (ref 5.0–8.0)

## 2022-02-26 LAB — CBC WITH DIFFERENTIAL/PLATELET
Abs Immature Granulocytes: 0.04 10*3/uL (ref 0.00–0.07)
Basophils Absolute: 0.1 10*3/uL (ref 0.0–0.1)
Basophils Relative: 1 %
Eosinophils Absolute: 0.7 10*3/uL — ABNORMAL HIGH (ref 0.0–0.5)
Eosinophils Relative: 6 %
HCT: 32 % — ABNORMAL LOW (ref 36.0–46.0)
Hemoglobin: 10.1 g/dL — ABNORMAL LOW (ref 12.0–15.0)
Immature Granulocytes: 0 %
Lymphocytes Relative: 28 %
Lymphs Abs: 2.9 10*3/uL (ref 0.7–4.0)
MCH: 27.8 pg (ref 26.0–34.0)
MCHC: 31.6 g/dL (ref 30.0–36.0)
MCV: 88.2 fL (ref 80.0–100.0)
Monocytes Absolute: 0.8 10*3/uL (ref 0.1–1.0)
Monocytes Relative: 8 %
Neutro Abs: 5.9 10*3/uL (ref 1.7–7.7)
Neutrophils Relative %: 57 %
Platelets: 442 10*3/uL — ABNORMAL HIGH (ref 150–400)
RBC: 3.63 MIL/uL — ABNORMAL LOW (ref 3.87–5.11)
RDW: 13.4 % (ref 11.5–15.5)
WBC: 10.4 10*3/uL (ref 4.0–10.5)
nRBC: 0 % (ref 0.0–0.2)

## 2022-02-26 LAB — BASIC METABOLIC PANEL
Anion gap: 10 (ref 5–15)
BUN: 9 mg/dL (ref 6–20)
CO2: 23 mmol/L (ref 22–32)
Calcium: 8.9 mg/dL (ref 8.9–10.3)
Chloride: 103 mmol/L (ref 98–111)
Creatinine, Ser: 0.66 mg/dL (ref 0.44–1.00)
GFR, Estimated: >60 mL/min (ref >60)
Glucose, Bld: 89 mg/dL (ref 70–99)
Potassium: 3.3 mmol/L — ABNORMAL LOW (ref 3.5–5.1)
Sodium: 136 mmol/L (ref 135–145)

## 2022-02-26 LAB — I-STAT BETA HCG BLOOD, ED (MC, WL, AP ONLY): I-stat hCG, quantitative: <5 m[IU]/mL (ref <5)

## 2022-02-26 MED ORDER — HYDROCHLOROTHIAZIDE 12.5 MG PO TABS
12.5000 mg | ORAL_TABLET | Freq: Every day | ORAL | Status: DC
  Administered 2022-02-26: 12.5 mg via ORAL
  Filled 2022-02-26: qty 1

## 2022-02-26 MED ORDER — HYDROCHLOROTHIAZIDE 12.5 MG PO TABS: 12.5000 mg | ORAL_TABLET | Freq: Every day | ORAL | 3 refills | Status: DC

## 2022-02-26 NOTE — ED Triage Notes (Signed)
Pt complains of headache that started last night. Went to urgent care today and bp was 170/110. Pt states stopped taking bp meds da few months ago and was trying to decreased BP with homeopathic remedy. Pt took advil this morning around 10am but nothing PTA. PT denies any chest pain.

## 2022-02-26 NOTE — ED Provider Notes (Signed)
[place] EMERGENCY DEPARTMENT AT [facility] Provider Note   CSN: [id number] Arrival date & time: 02/26/22  1558     History  Chief Complaint  Patient presents with   Hypertension    Bethany Conley is a 52 y.o. female with a past medical history of hypertension presenting today due to the complaint of hypertension.  She reports that yesterday she was having a headache and vertigo type feelings.  She went to sleep and woke up feeling better.  On the way to work she started to have a headache again and took some Aleve.  While at work she started to feel somewhat dizzy again so she went to urgent care.  At that time they told her her blood pressure was very elevated and sent her to the emergency department.  Denies current headache, blurred vision, chest pain, back pain.  Says that she was previously diagnosed with hypertension and was prescribed HCTZ.  Reports she has not taken this in multiple months because she has been trying to treat it with home remedies such as beets in her diet.  Currently denies a headache, slurred speech, facial droop, numbness or tingling.   Hypertension       Home Medications Prior to Admission medications   Medication Sig Start Date End Date Taking? Authorizing Provider  hydrochlorothiazide (HYDRODIURIL) 12.5 MG tablet Take 1 tablet (12.5 mg total) by mouth daily. 02/26/22   Devaughn Savant A, PA-C      Allergies    Sulfa antibiotics    Review of Systems   Review of Systems  Physical Exam Updated Vital Signs BP (!) 147/93 (BP Location: Right Arm)   Pulse 81   Temp 98.3 F (36.8 C) (Oral)   Resp 18   Ht 5\' 11"  (1.803 m)   Wt (!) 161.5 kg   LMP 02/18/2022 (Exact Date)   SpO2 100%   BMI 49.65 kg/m  Physical Exam Vitals and nursing note reviewed.  Constitutional:      Appearance: Normal appearance.  HENT:     Head: Normocephalic and atraumatic.  Eyes:     General: No scleral icterus.    Extraocular Movements: Extraocular  movements intact.     Conjunctiva/sclera: Conjunctivae normal.     Pupils: Pupils are equal, round, and reactive to light.  Cardiovascular:     Rate and Rhythm: Normal rate and regular rhythm.  Pulmonary:     Effort: Pulmonary effort is normal. No respiratory distress.  Skin:    Findings: No rash.  Neurological:     Mental Status: She is alert.     Comments: Cranial nerves II through XII grossly intact.  No problem with finger-nose testing.  Normal strength in bilateral upper and lower extremities.  Normal sensation on both sides of the patient's face.  No facial droop, aphasia or ataxia noted.  Psychiatric:        Mood and Affect: Mood normal.     ED Results / Procedures / Treatments   Labs (all labs ordered are listed, but only abnormal results are displayed) Labs Reviewed  BASIC METABOLIC PANEL - Abnormal; Notable for the following components:      Result Value   Potassium 3.3 (*)    All other components within normal limits  CBC WITH DIFFERENTIAL/PLATELET - Abnormal; Notable for the following components:   RBC 3.63 (*)    Hemoglobin 10.1 (*)    HCT 32.0 (*)    Platelets 442 (*)    Eosinophils Absolute 0.7 (*)  All other components within normal limits  URINALYSIS, ROUTINE W REFLEX MICROSCOPIC - Abnormal; Notable for the following components:   Color, Urine AMBER (*)    APPearance HAZY (*)    Hgb urine dipstick MODERATE (*)    Protein, ur 100 (*)    Leukocytes,Ua TRACE (*)    Bacteria, UA FEW (*)    All other components within normal limits  I-STAT BETA HCG BLOOD, ED ([facility], [facility], [facility] ONLY)    EKG None  Radiology CT Head Wo Contrast  Result Date: 02/26/2022 CLINICAL DATA:  Headache EXAM: CT HEAD WITHOUT CONTRAST TECHNIQUE: Contiguous axial images were obtained from the base of the skull through the vertex without intravenous contrast. RADIATION DOSE REDUCTION: This exam was performed according to the departmental dose-optimization program which includes automated  exposure control, adjustment of the mA and/or kV according to patient size and/or use of iterative reconstruction technique. COMPARISON:  05/25/2015 FINDINGS: Brain: No evidence of acute infarction, hemorrhage, mass, mass effect, or midline shift. No hydrocephalus or extra-axial fluid collection. Calcifications in the bilateral basal ganglia. Cerebral volume is within normal limits for age. Vascular: No hyperdense vessel. Skull: Negative for fracture or focal lesion. Sinuses/Orbits: Minimal mucosal thickening in the right maxillary sinus. No acute finding in the orbits. Other: The mastoid air cells are well aerated. IMPRESSION: No acute intracranial process. Electronically Signed   By: Merilyn Baba M.D.   On: 02/26/2022 19:17    Procedures Procedures   Medications Ordered in ED Medications  hydrochlorothiazide (HYDRODIURIL) tablet 12.5 mg (has no administration in time range)    ED Course/ Medical Decision Making/ A&P                             Medical Decision Making Risk Prescription drug management.   52 year old female presenting today due to hypertension.  Went to urgent care and they said her blood pressure was too high and she had complained of a headache.  Concerns include intracranial hemorrhage.    Past Medical History / Co-morbidities / Social History: Hypertension and obesity   Physical Exam: Pertinent physical exam findings include Normal neuroexam  Lab Tests: I ordered, and personally interpreted labs.  The pertinent results include: Hematuria but normal kidney function   Imaging Studies: CT ordered in triage.  I viewed and interpreted this and agree that there are no acute findings.    Medications: Given first dose of HCTZ   MDM/Disposition: =52 year old female presenting from urgent care due to hypertension.  She has been complaining about headaches and due to her hypertension they sent her to the emergency department.  She has been trying to treat her known  hypertension with a change in her diet.  Today she has been hypertensive as high as 180s over 100.  She denies any headache at this time, chest pain, back pain.  Triage workup was initiated and she had a normal head CT and lab work.  Urinalysis does have some blood however patient tells me that she started her menstrual cycle out of nowhere.  She is waiting for OB/GYN to get her in because she is postmenopausal.  We discussed the importance of this.  At this time patient is showing no signs of hypertensive urgency/emergency.  She is stable to be discharged home with outpatient PCP follow-up.  She does have very uncontrolled hypertension so she was restarted on her HCTZ and given the first dose today.  She says that she will  take this outpatient and follow-up as planned.  She was given a log to keep track of her pressures and discharged in stable condition.  Final Clinical Impression(s) / ED Diagnoses Final diagnoses:  Uncontrolled hypertension    Rx / DC Orders ED Discharge Orders          Ordered    hydrochlorothiazide (HYDRODIURIL) 12.5 MG tablet  Daily        02/26/22 2019           Results and diagnoses were explained to the patient. Return precautions discussed in full. Patient had no additional questions and expressed complete understanding.   This chart was dictated using voice recognition software.  Despite best efforts to proofread,  errors can occur which can change the documentation meaning.     Darliss Ridgel 02/26/22 2047    Godfrey Pick, MD 02/27/22 [id number]

## 2022-02-26 NOTE — Discharge Instructions (Addendum)
You came to the emergency department due to elevated blood pressure.  This can be very dangerous for all of the organs that we spoke about today.  There is additional information about hypertension attached to these discharge papers.  Please restart your HCTZ.  I have sent this to your pharmacy.  There is also a form attached to these papers that you may use to record your blood pressure in the morning and at night.  Do not hesitate to return to the emergency department with any recurring headaches, chest pain, back pain or other concerns.  It was a pleasure to meet you and we hope you continue to feel better!  Your work note is attached.

## 2022-02-26 NOTE — ED Provider Triage Note (Signed)
Emergency Medicine Provider Triage Evaluation Note  Bethany Conley , a 52 y.o. female  was evaluated in triage.  Pt complains of elevated blood pressure onset yesterday.  Patient notes that she has been out of her antihypertensives for several months.  Advil without relief of her symptoms.  Denies chest pain, shortness of breath, nausea, vomiting, dizziness, diplopia.  No she had episode of blurred vision yesterday.  Headache is localized to the left temporal region and is a throbbing sensation.  Review of Systems  Positive:  Negative:   Physical Exam  BP (!) 181/101 (BP Location: Right Arm)   Pulse 79   Temp 98.8 F (37.1 C) (Oral)   Resp 16   Ht 5\' 11"  (1.803 m)   Wt (!) 161.5 kg   LMP 02/18/2022 (Exact Date)   SpO2 99%   BMI 49.65 kg/m  Gen:   Awake, no distress   Resp:  Normal effort  MSK:   Moves extremities without difficulty  Other:  Strength and sensation intact to bilateral upper and lower extremities.  Grip strength 5/5 bilaterally.  Negative pronator drift.  Able to ambulate without assistance or difficulty.  Medical Decision Making  Medically screening exam initiated at 5:31 PM.  Appropriate orders placed.  Shyvonne Chastang Captain was informed that the remainder of the evaluation will be completed by another provider, this initial triage assessment does not replace that evaluation, and the importance of remaining in the ED until their evaluation is complete.  Workup initiated   Solace Wendorff A, PA-C 02/26/22 1733

## 2022-03-25 LAB — HM MAMMOGRAPHY

## 2022-05-01 ENCOUNTER — Encounter (HOSPITAL_BASED_OUTPATIENT_CLINIC_OR_DEPARTMENT_OTHER): Payer: Self-pay | Admitting: Obstetrics and Gynecology

## 2022-05-01 NOTE — Progress Notes (Signed)
Your procedure is scheduled on   Tuesday,  05-07-2022  Report to [facility] AT  __6:45_ AM.   Call this number if you have problems the morning of surgery  :[phone].   OUR ADDRESS IS [address].  WE ARE LOCATED IN [facility].  PLEASE BRING YOUR INSURANCE CARD AND PHOTO ID DAY OF SURGERY.  ONLY 2 PEOPLE ARE ALLOWED IN  WAITING  ROOM                                      REMEMBER:  DO NOT EAT FOOD AFTER MIDNIGHT THE NIGHT BEFORE YOUR SURGERY . YOU MAY HAVE CLEAR LIQUIDS FROM MIDNIGHT THE NIGHT BEFORE YOUR SURGERY UNTIL  5:45 AM_. NO CLEAR LIQUIDS AFTER   5:45 AM_ DAY OF SURGERY.  YOU MAY  BRUSH YOUR TEETH MORNING OF SURGERY AND RINSE YOUR MOUTH OUT, NO CHEWING GUM CANDY OR MINTS.   CLEAR LIQUIDS ALLOWED   Water                                                                   Black Coffee and tea, regular and decaf  (NO cream or milk products of any type, may sweeten)                         Carbonated beverages, regular and diet                                    Sports drinks like Gatorade _____________________________________________________________________     TAKE ONLY THESE MEDICATIONS MORNING OF SURGERY:  none Do not take hydrochlorothiazide morning of surgery per anesthesia    UP TO 4 VISITORS  MAY VISIT IN THE EXTENDED RECOVERY ROOM UNTIL 800 PM ONLY.  ONE  VISITOR AGE 78 AND OVER MAY SPEND THE NIGHT AND MUST BE IN EXTENDED RECOVERY ROOM NO LATER THAN 800 PM . YOUR DISCHARGE TIME AFTER YOU SPEND THE NIGHT IS 900 AM THE MORNING AFTER YOUR SURGERY.  YOU MAY PACK A SMALL OVERNIGHT BAG WITH TOILETRIES FOR YOUR OVERNIGHT STAY IF YOU WISH.  YOUR PRESCRIPTION MEDICATIONS WILL BE PROVIDED DURING [place].                                      DO NOT WEAR JEWERLY/  METAL/  PIERCINGS (INCLUDING NO PLASTIC PIERCINGS) DO NOT WEAR LOTIONS, POWDERS, PERFUMES OR NAIL POLISH ON YOUR FINGERNAILS. TOENAIL POLISH IS OK TO  WEAR. DO NOT SHAVE FOR 48 HOURS PRIOR TO DAY OF SURGERY.  CONTACTS, GLASSES, OR DENTURES MAY NOT BE WORN TO SURGERY.  REMEMBER: NO SMOKING, VAPING ,  DRUGS OR ALCOHOL FOR 24 HOURS BEFORE YOUR SURGERY.                                    [facility] IS NOT RESPONSIBLE  FOR ANY  BELONGINGS.                                                                    Marland Kitchen           [name] - Preparing for Surgery Before surgery, you can play an important role.  Because skin is not sterile, your skin needs to be as free of germs as possible.  You can reduce the number of germs on your skin by washing with CHG (chlorahexidine gluconate) soap before surgery.  CHG is an antiseptic cleaner which kills germs and bonds with the skin to continue killing germs even after washing. Please DO NOT use if you have an allergy to CHG or antibacterial soaps.  If your skin becomes reddened/irritated stop using the CHG and inform your nurse when you arrive at Short Stay. Do not shave (including legs and underarms) for at least 48 hours prior to the first CHG shower.  You may shave your face/neck. Please follow these instructions carefully:  1.  Shower with CHG Soap the night before surgery and the  morning of Surgery.  2.  If you choose to wash your hair, wash your hair first as usual with your  normal  shampoo.  3.  After you shampoo, rinse your hair and body thoroughly to remove the  shampoo.                                        4.  Use CHG as you would any other liquid soap.  You can apply chg directly  to the skin and wash , chg soap provided, night before and morning of your surgery.  5.  Apply the CHG Soap to your body ONLY FROM THE NECK DOWN.   Do not use on face/ open                           Wound or open sores. Avoid contact with eyes, ears mouth and genitals (private parts).                       Wash face,  Genitals (private parts) with your normal soap.             6.  Wash thoroughly, paying special attention  to the area where your surgery  will be performed.  7.  Thoroughly rinse your body with warm water from the neck down.  8.  DO NOT shower/wash with your normal soap after using and rinsing off  the CHG Soap.             9.  Pat yourself dry with a clean towel.            10.  Wear clean pajamas.            11.  Place clean sheets on your bed the night of your first shower and do not  sleep with pets. Day of Surgery : Do not apply any lotions/ powders the morning of surgery.  Please wear clean clothes to the hospital/surgery center.  IF YOU HAVE ANY SKIN IRRITATION  OR PROBLEMS WITH THE SURGICAL SOAP, PLEASE GET A BAR OF GOLD DIAL SOAP AND SHOWER THE NIGHT BEFORE YOUR SURGERY AND THE MORNING OF YOUR SURGERY. PLEASE LET THE NURSE KNOW MORNING OF YOUR SURGERY IF YOU HAD ANY PROBLEMS WITH THE SURGICAL SOAP.   ________________________________________________________________________                                                        QUESTIONS Bethany Conley PRE OP NURSE PHONE [phone].

## 2022-05-01 NOTE — Progress Notes (Signed)
Spoke w/ via phone for pre-op interview--- pt Lab needs dos----  urine preg             Lab results------ pt getting lab work done 05-06-2022 @ 0900, CBC, BMP, T&S;  current EKG in epic/ chart COVID test -----patient states asymptomatic no test needed Arrive at -------  0645 in 05-07-2022 NPO after MN NO Solid Food.  Clear liquids from MN until--- 0545 Med rec completed Medications to take morning of surgery ----- none Diabetic medication ----- n/a Patient instructed no nail polish to be worn day of surgery Patient instructed to bring photo id and insurance card day of surgery Patient aware to have Driver (ride ) / caregiver    for 24 hours after surgery -- friend (god-mother), betty Patient Special Instructions ----- pt will be pick up bag w/ hibiclens soap and instructions at lap appt Pre-Op special Istructions ----- n/a Patient verbalized understanding of instructions that were given at this phone interview. Patient denies shortness of breath, chest pain, fever, cough at this phone interview.

## 2022-05-06 ENCOUNTER — Encounter (HOSPITAL_COMMUNITY)
Admission: RE | Admit: 2022-05-06 | Discharge: 2022-05-06 | Disposition: A | Discharge status: Home / Self Care | Payer: 59 | Source: Ambulatory Visit | Attending: Obstetrics and Gynecology | Admitting: Obstetrics and Gynecology

## 2022-05-06 DIAGNOSIS — Z01812 Encounter for preprocedural laboratory examination: Secondary | ICD-10-CM | POA: Insufficient documentation

## 2022-05-06 DIAGNOSIS — Z01818 Encounter for other preprocedural examination: Secondary | ICD-10-CM

## 2022-05-06 LAB — BASIC METABOLIC PANEL
Anion gap: 7 (ref 5–15)
BUN: 14 mg/dL (ref 6–20)
CO2: 25 mmol/L (ref 22–32)
Calcium: 8.8 mg/dL — ABNORMAL LOW (ref 8.9–10.3)
Chloride: 105 mmol/L (ref 98–111)
Creatinine, Ser: 0.51 mg/dL (ref 0.44–1.00)
GFR, Estimated: >60 mL/min (ref >60)
Glucose, Bld: 86 mg/dL (ref 70–99)
Potassium: 3.4 mmol/L — ABNORMAL LOW (ref 3.5–5.1)
Sodium: 137 mmol/L (ref 135–145)

## 2022-05-06 LAB — CBC
HCT: 33 % — ABNORMAL LOW (ref 36.0–46.0)
Hemoglobin: 10.2 g/dL — ABNORMAL LOW (ref 12.0–15.0)
MCH: 27.3 pg (ref 26.0–34.0)
MCHC: 30.9 g/dL (ref 30.0–36.0)
MCV: 88.2 fL (ref 80.0–100.0)
Platelets: 359 10*3/uL (ref 150–400)
RBC: 3.74 MIL/uL — ABNORMAL LOW (ref 3.87–5.11)
RDW: 14 % (ref 11.5–15.5)
WBC: 8.7 10*3/uL (ref 4.0–10.5)
nRBC: 0 % (ref 0.0–0.2)

## 2022-05-07 ENCOUNTER — Ambulatory Visit (HOSPITAL_BASED_OUTPATIENT_CLINIC_OR_DEPARTMENT_OTHER)
Admission: RE | Admit: 2022-05-07 | Discharge: 2022-05-08 | Disposition: A | Discharge status: Home / Self Care | Payer: 59 | Source: Ambulatory Visit | Attending: Obstetrics and Gynecology | Admitting: Obstetrics and Gynecology

## 2022-05-07 ENCOUNTER — Ambulatory Visit (HOSPITAL_BASED_OUTPATIENT_CLINIC_OR_DEPARTMENT_OTHER): Payer: 59 | Admitting: Certified Registered Nurse Anesthetist

## 2022-05-07 ENCOUNTER — Encounter (HOSPITAL_BASED_OUTPATIENT_CLINIC_OR_DEPARTMENT_OTHER)
Admission: RE | Disposition: A | Discharge status: Home / Self Care | Payer: Self-pay | Source: Ambulatory Visit | Attending: Obstetrics and Gynecology

## 2022-05-07 ENCOUNTER — Other Ambulatory Visit: Payer: Self-pay

## 2022-05-07 ENCOUNTER — Encounter (HOSPITAL_BASED_OUTPATIENT_CLINIC_OR_DEPARTMENT_OTHER): Payer: Self-pay | Admitting: Obstetrics and Gynecology

## 2022-05-07 DIAGNOSIS — N840 Polyp of corpus uteri: Secondary | ICD-10-CM | POA: Diagnosis not present

## 2022-05-07 DIAGNOSIS — N8003 Adenomyosis of the uterus: Secondary | ICD-10-CM | POA: Diagnosis not present

## 2022-05-07 DIAGNOSIS — Z6841 Body Mass Index (BMI) 40.0 and over, adult: Secondary | ICD-10-CM | POA: Insufficient documentation

## 2022-05-07 DIAGNOSIS — N939 Abnormal uterine and vaginal bleeding, unspecified: Secondary | ICD-10-CM

## 2022-05-07 DIAGNOSIS — I10 Essential (primary) hypertension: Secondary | ICD-10-CM | POA: Diagnosis not present

## 2022-05-07 DIAGNOSIS — Z79899 Other long term (current) drug therapy: Secondary | ICD-10-CM | POA: Diagnosis not present

## 2022-05-07 DIAGNOSIS — D259 Leiomyoma of uterus, unspecified: Secondary | ICD-10-CM | POA: Diagnosis not present

## 2022-05-07 DIAGNOSIS — M1990 Unspecified osteoarthritis, unspecified site: Secondary | ICD-10-CM | POA: Insufficient documentation

## 2022-05-07 DIAGNOSIS — N838 Other noninflammatory disorders of ovary, fallopian tube and broad ligament: Secondary | ICD-10-CM | POA: Diagnosis not present

## 2022-05-07 DIAGNOSIS — Z01818 Encounter for other preprocedural examination: Secondary | ICD-10-CM

## 2022-05-07 HISTORY — PX: TOTAL LAPAROSCOPIC HYSTERECTOMY WITH SALPINGECTOMY: SHX6742

## 2022-05-07 HISTORY — DX: Abnormal uterine and vaginal bleeding, unspecified: N93.9

## 2022-05-07 HISTORY — DX: Anemia, unspecified: D64.9

## 2022-05-07 HISTORY — DX: Unspecified osteoarthritis, unspecified site: M19.90

## 2022-05-07 HISTORY — DX: Essential (primary) hypertension: I10

## 2022-05-07 HISTORY — PX: CYSTOSCOPY: SHX5120

## 2022-05-07 HISTORY — DX: Leiomyoma of uterus, unspecified: D25.9

## 2022-05-07 HISTORY — DX: Morbid (severe) obesity due to excess calories: E66.01

## 2022-05-07 LAB — COMPREHENSIVE METABOLIC PANEL
ALT: 13 U/L (ref 0–44)
AST: 26 U/L (ref 15–41)
Albumin: 3.6 g/dL (ref 3.5–5.0)
Alkaline Phosphatase: 82 U/L (ref 38–126)
Anion gap: 10 (ref 5–15)
BUN: 11 mg/dL (ref 6–20)
CO2: 23 mmol/L (ref 22–32)
Calcium: 8.3 mg/dL — ABNORMAL LOW (ref 8.9–10.3)
Chloride: 102 mmol/L (ref 98–111)
Creatinine, Ser: 0.68 mg/dL (ref 0.44–1.00)
GFR, Estimated: >60 mL/min (ref >60)
Glucose, Bld: 154 mg/dL — ABNORMAL HIGH (ref 70–99)
Potassium: 3.6 mmol/L (ref 3.5–5.1)
Sodium: 135 mmol/L (ref 135–145)
Total Bilirubin: 0.5 mg/dL (ref 0.3–1.2)
Total Protein: 7.6 g/dL (ref 6.5–8.1)

## 2022-05-07 LAB — ABO/RH: ABO/RH(D): O POS

## 2022-05-07 LAB — TYPE AND SCREEN
ABO/RH(D): O POS
Antibody Screen: NEGATIVE

## 2022-05-07 LAB — CBC
HCT: 33.6 % — ABNORMAL LOW (ref 36.0–46.0)
Hemoglobin: 10.3 g/dL — ABNORMAL LOW (ref 12.0–15.0)
MCH: 28 pg (ref 26.0–34.0)
MCHC: 30.7 g/dL (ref 30.0–36.0)
MCV: 91.3 fL (ref 80.0–100.0)
Platelets: 362 10*3/uL (ref 150–400)
RBC: 3.68 MIL/uL — ABNORMAL LOW (ref 3.87–5.11)
RDW: 14 % (ref 11.5–15.5)
WBC: 15.8 10*3/uL — ABNORMAL HIGH (ref 4.0–10.5)
nRBC: 0 % (ref 0.0–0.2)

## 2022-05-07 LAB — POCT PREGNANCY, URINE: Preg Test, Ur: NEGATIVE

## 2022-05-07 SURGERY — HYSTERECTOMY, TOTAL, LAPAROSCOPIC, WITH SALPINGECTOMY
Anesthesia: General

## 2022-05-07 MED ORDER — FLUORESCEIN SODIUM 10 % IV SOLN
INTRAVENOUS | Status: AC
  Filled 2022-05-07: qty 5

## 2022-05-07 MED ORDER — ACETAMINOPHEN 500 MG PO TABS
ORAL_TABLET | ORAL | Status: AC
  Filled 2022-05-07: qty 2

## 2022-05-07 MED ORDER — KETOROLAC TROMETHAMINE 30 MG/ML IJ SOLN
INTRAMUSCULAR | Status: AC
  Filled 2022-05-07: qty 1

## 2022-05-07 MED ORDER — DOCUSATE SODIUM 100 MG PO CAPS
ORAL_CAPSULE | ORAL | Status: AC
  Filled 2022-05-07: qty 1

## 2022-05-07 MED ORDER — HYDROCHLOROTHIAZIDE 12.5 MG PO TABS
12.5000 mg | ORAL_TABLET | Freq: Every day | ORAL | Status: DC
  Administered 2022-05-07: 12.5 mg via ORAL
  Filled 2022-05-07: qty 1

## 2022-05-07 MED ORDER — OXYCODONE HCL 5 MG PO TABS
ORAL_TABLET | ORAL | Status: AC
  Filled 2022-05-07: qty 1

## 2022-05-07 MED ORDER — CEFAZOLIN IN SODIUM CHLORIDE 3-0.9 GM/100ML-% IV SOLN
3.0000 g | INTRAVENOUS | Status: AC
  Administered 2022-05-07: 3 g via INTRAVENOUS

## 2022-05-07 MED ORDER — GLYCOPYRROLATE PF 0.2 MG/ML IJ SOSY
PREFILLED_SYRINGE | INTRAMUSCULAR | Status: AC
  Filled 2022-05-07: qty 1

## 2022-05-07 MED ORDER — HYDROMORPHONE HCL 2 MG PO TABS
ORAL_TABLET | ORAL | Status: AC
  Filled 2022-05-07: qty 1

## 2022-05-07 MED ORDER — PROPOFOL 10 MG/ML IV BOLUS
INTRAVENOUS | Status: AC
  Filled 2022-05-07: qty 20

## 2022-05-07 MED ORDER — ROCURONIUM BROMIDE 10 MG/ML (PF) SYRINGE
PREFILLED_SYRINGE | INTRAVENOUS | Status: AC
  Filled 2022-05-07: qty 10

## 2022-05-07 MED ORDER — LACTATED RINGERS IV SOLN: INTRAVENOUS | Status: DC

## 2022-05-07 MED ORDER — TRANEXAMIC ACID-NACL 1000-0.7 MG/100ML-% IV SOLN
1000.0000 mg | Freq: Once | INTRAVENOUS | Status: AC
  Administered 2022-05-07: 1000 mg via INTRAVENOUS

## 2022-05-07 MED ORDER — IBUPROFEN 200 MG PO TABS: 600.0000 mg | ORAL_TABLET | Freq: Four times a day (QID) | ORAL | Status: DC

## 2022-05-07 MED ORDER — HYDROMORPHONE HCL 2 MG/ML IJ SOLN
INTRAMUSCULAR | Status: AC
  Filled 2022-05-07: qty 1

## 2022-05-07 MED ORDER — SOD CITRATE-CITRIC ACID 500-334 MG/5ML PO SOLN: 30.0000 mL | ORAL | Status: DC

## 2022-05-07 MED ORDER — FENTANYL CITRATE (PF) 100 MCG/2ML IJ SOLN
INTRAMUSCULAR | Status: DC | PRN
  Administered 2022-05-07 (×2): 50 ug via INTRAVENOUS
  Administered 2022-05-07: 100 ug via INTRAVENOUS

## 2022-05-07 MED ORDER — AMISULPRIDE (ANTIEMETIC) 5 MG/2ML IV SOLN: 10.0000 mg | Freq: Once | INTRAVENOUS | Status: DC | PRN

## 2022-05-07 MED ORDER — TRANEXAMIC ACID-NACL 1000-0.7 MG/100ML-% IV SOLN
INTRAVENOUS | Status: AC
  Filled 2022-05-07: qty 100

## 2022-05-07 MED ORDER — KETAMINE HCL 50 MG/5ML IJ SOSY
PREFILLED_SYRINGE | INTRAMUSCULAR | Status: AC
  Filled 2022-05-07: qty 5

## 2022-05-07 MED ORDER — DEXAMETHASONE SODIUM PHOSPHATE 10 MG/ML IJ SOLN
INTRAMUSCULAR | Status: AC
  Filled 2022-05-07: qty 1

## 2022-05-07 MED ORDER — SIMETHICONE 80 MG PO CHEW: 80.0000 mg | CHEWABLE_TABLET | Freq: Four times a day (QID) | ORAL | Status: DC | PRN

## 2022-05-07 MED ORDER — DEXAMETHASONE SODIUM PHOSPHATE 10 MG/ML IJ SOLN
INTRAMUSCULAR | Status: DC | PRN
  Administered 2022-05-07: 10 mg via INTRAVENOUS

## 2022-05-07 MED ORDER — OXYCODONE HCL 5 MG PO TABS
5.0000 mg | ORAL_TABLET | ORAL | Status: DC | PRN
  Administered 2022-05-07: 5 mg via ORAL
  Administered 2022-05-07: 10 mg via ORAL
  Administered 2022-05-07: 5 mg via ORAL
  Administered 2022-05-08 (×2): 10 mg via ORAL

## 2022-05-07 MED ORDER — FENTANYL CITRATE (PF) 100 MCG/2ML IJ SOLN
INTRAMUSCULAR | Status: AC
  Filled 2022-05-07: qty 2

## 2022-05-07 MED ORDER — FLUORESCEIN SODIUM 10 % IV SOLN
INTRAVENOUS | Status: DC | PRN
  Administered 2022-05-07: 25 mg via INTRAVENOUS

## 2022-05-07 MED ORDER — CEFAZOLIN SODIUM-DEXTROSE 2-4 GM/100ML-% IV SOLN
INTRAVENOUS | Status: AC
  Filled 2022-05-07: qty 100

## 2022-05-07 MED ORDER — MIDAZOLAM HCL 2 MG/2ML IJ SOLN
INTRAMUSCULAR | Status: AC
  Filled 2022-05-07: qty 2

## 2022-05-07 MED ORDER — DOCUSATE SODIUM 100 MG PO CAPS
100.0000 mg | ORAL_CAPSULE | Freq: Two times a day (BID) | ORAL | Status: DC
  Administered 2022-05-07 (×2): 100 mg via ORAL

## 2022-05-07 MED ORDER — MEPERIDINE HCL 25 MG/ML IJ SOLN: 6.2500 mg | INTRAMUSCULAR | Status: DC | PRN

## 2022-05-07 MED ORDER — ONDANSETRON HCL 4 MG/2ML IJ SOLN
INTRAMUSCULAR | Status: DC | PRN
  Administered 2022-05-07: 4 mg via INTRAVENOUS

## 2022-05-07 MED ORDER — HYDROMORPHONE HCL 1 MG/ML IJ SOLN
INTRAMUSCULAR | Status: DC | PRN
  Administered 2022-05-07 (×2): .5 mg via INTRAVENOUS

## 2022-05-07 MED ORDER — POVIDONE-IODINE 10 % EX SWAB: 2.0000 | Freq: Once | CUTANEOUS | Status: DC

## 2022-05-07 MED ORDER — KETAMINE HCL 10 MG/ML IJ SOLN
INTRAMUSCULAR | Status: DC | PRN
  Administered 2022-05-07: 30 mg via INTRAVENOUS

## 2022-05-07 MED ORDER — ACETAMINOPHEN 500 MG PO TABS
1000.0000 mg | ORAL_TABLET | Freq: Four times a day (QID) | ORAL | Status: DC
  Administered 2022-05-07 – 2022-05-08 (×3): 1000 mg via ORAL

## 2022-05-07 MED ORDER — PROPOFOL 10 MG/ML IV BOLUS
INTRAVENOUS | Status: DC | PRN
  Administered 2022-05-07: 150 mg via INTRAVENOUS

## 2022-05-07 MED ORDER — OXYCODONE HCL 5 MG/5ML PO SOLN: 5.0000 mg | Freq: Once | ORAL | Status: DC | PRN

## 2022-05-07 MED ORDER — ONDANSETRON HCL 4 MG/2ML IJ SOLN: 4.0000 mg | Freq: Four times a day (QID) | INTRAMUSCULAR | Status: DC | PRN

## 2022-05-07 MED ORDER — SODIUM CHLORIDE (PF) 0.9 % IJ SOLN
INTRAMUSCULAR | Status: AC
  Filled 2022-05-07: qty 10

## 2022-05-07 MED ORDER — HYDROMORPHONE HCL 1 MG/ML IJ SOLN
INTRAMUSCULAR | Status: AC
  Filled 2022-05-07: qty 1

## 2022-05-07 MED ORDER — ROCURONIUM BROMIDE 10 MG/ML (PF) SYRINGE
PREFILLED_SYRINGE | INTRAVENOUS | Status: DC | PRN
  Administered 2022-05-07: 100 mg via INTRAVENOUS
  Administered 2022-05-07: 10 mg via INTRAVENOUS

## 2022-05-07 MED ORDER — GLYCOPYRROLATE 0.2 MG/ML IJ SOLN
INTRAMUSCULAR | Status: DC | PRN
  Administered 2022-05-07: .1 mg via INTRAVENOUS

## 2022-05-07 MED ORDER — PROMETHAZINE HCL 25 MG/ML IJ SOLN: 6.2500 mg | INTRAMUSCULAR | Status: DC | PRN

## 2022-05-07 MED ORDER — OXYCODONE HCL 5 MG PO TABS: 5.0000 mg | ORAL_TABLET | Freq: Once | ORAL | Status: DC | PRN

## 2022-05-07 MED ORDER — ONDANSETRON HCL 4 MG/2ML IJ SOLN
INTRAMUSCULAR | Status: AC
  Filled 2022-05-07: qty 2

## 2022-05-07 MED ORDER — KETOROLAC TROMETHAMINE 30 MG/ML IJ SOLN
INTRAMUSCULAR | Status: DC | PRN
  Administered 2022-05-07: 30 mg via INTRAVENOUS

## 2022-05-07 MED ORDER — OXYCODONE HCL 5 MG PO TABS
ORAL_TABLET | ORAL | Status: AC
  Filled 2022-05-07: qty 2

## 2022-05-07 MED ORDER — KETOROLAC TROMETHAMINE 30 MG/ML IJ SOLN
30.0000 mg | Freq: Four times a day (QID) | INTRAMUSCULAR | Status: DC
  Administered 2022-05-07 – 2022-05-08 (×3): 30 mg via INTRAVENOUS

## 2022-05-07 MED ORDER — HYDROMORPHONE HCL 1 MG/ML IJ SOLN
0.2500 mg | INTRAMUSCULAR | Status: DC | PRN
  Administered 2022-05-07 (×4): 0.25 mg via INTRAVENOUS

## 2022-05-07 MED ORDER — LIDOCAINE 2% (20 MG/ML) 5 ML SYRINGE
INTRAMUSCULAR | Status: DC | PRN
  Administered 2022-05-07: 100 mg via INTRAVENOUS

## 2022-05-07 MED ORDER — ACETAMINOPHEN 500 MG PO TABS
1000.0000 mg | ORAL_TABLET | ORAL | Status: AC
  Administered 2022-05-07: 1000 mg via ORAL

## 2022-05-07 MED ORDER — SODIUM CHLORIDE 0.9 % IR SOLN
Status: DC | PRN
  Administered 2022-05-07: 1000 mL

## 2022-05-07 MED ORDER — SUGAMMADEX SODIUM 200 MG/2ML IV SOLN
INTRAVENOUS | Status: DC | PRN
  Administered 2022-05-07: 200 mg via INTRAVENOUS

## 2022-05-07 MED ORDER — CEFAZOLIN IN SODIUM CHLORIDE 3-0.9 GM/100ML-% IV SOLN
INTRAVENOUS | Status: AC
  Filled 2022-05-07: qty 100

## 2022-05-07 MED ORDER — MIDAZOLAM HCL 5 MG/5ML IJ SOLN
INTRAMUSCULAR | Status: DC | PRN
  Administered 2022-05-07: 2 mg via INTRAVENOUS

## 2022-05-07 MED ORDER — BUPIVACAINE HCL (PF) 0.25 % IJ SOLN
INTRAMUSCULAR | Status: DC | PRN
  Administered 2022-05-07: 12 mL

## 2022-05-07 MED ORDER — ONDANSETRON HCL 4 MG PO TABS: 4.0000 mg | ORAL_TABLET | Freq: Four times a day (QID) | ORAL | Status: DC | PRN

## 2022-05-07 SURGICAL SUPPLY — 48 items
ADH SKN CLS APL DERMABOND .7 (GAUZE/BANDAGES/DRESSINGS) ×2
BLADE SURG 10 STRL SS (BLADE) IMPLANT
COVER MAYO STAND STRL (DRAPES) ×2 IMPLANT
DERMABOND ADVANCED .7 DNX12 (GAUZE/BANDAGES/DRESSINGS) ×2 IMPLANT
DEVICE SUTURE ENDOST 10MM (ENDOMECHANICALS) ×2 IMPLANT
DRAPE SURG IRRIG POUCH 19X23 (DRAPES) ×2 IMPLANT
DURAPREP 26ML APPLICATOR (WOUND CARE) ×2 IMPLANT
GAUZE 4X4 16PLY ~~LOC~~+RFID DBL (SPONGE) ×2 IMPLANT
GLOVE BIOGEL PI IND STRL 6.5 (GLOVE) ×4 IMPLANT
GLOVE ECLIPSE 6.5 STRL STRAW (GLOVE) ×2 IMPLANT
GOWN STRL REUS W/TWL LRG LVL3 (GOWN DISPOSABLE) ×6 IMPLANT
HIBICLENS CHG 4% 4OZ BTL (MISCELLANEOUS) ×2 IMPLANT
KIT PINK PAD W/HEAD ARE REST (MISCELLANEOUS) ×2
KIT PINK PAD W/HEAD ARM REST (MISCELLANEOUS) ×2 IMPLANT
KIT TURNOVER CYSTO (KITS) ×2 IMPLANT
L-HOOK LAP DISP 36CM (ELECTROSURGICAL) ×2
LHOOK LAP DISP 36CM (ELECTROSURGICAL) ×2 IMPLANT
LIGASURE VESSEL 5MM BLUNT TIP (ELECTROSURGICAL) ×2 IMPLANT
MANIPULATOR VCARE LG CRV RETR (MISCELLANEOUS) IMPLANT
MANIPULATOR VCARE SML CRV RETR (MISCELLANEOUS) IMPLANT
MANIPULATOR VCARE STD CRV RETR (MISCELLANEOUS) IMPLANT
NS IRRIG 1000ML POUR BTL (IV SOLUTION) ×2 IMPLANT
OCCLUDER COLPOPNEUMO (BALLOONS) IMPLANT
PACK LAPAROSCOPY BASIN (CUSTOM PROCEDURE TRAY) ×2 IMPLANT
PENCIL BUTTON HOLSTER BLD 10FT (ELECTRODE) ×2 IMPLANT
PROTECTOR NERVE ULNAR (MISCELLANEOUS) ×4 IMPLANT
SCISSORS LAP 5X35 DISP (ENDOMECHANICALS) IMPLANT
SET IRRIG Y TYPE TUR BLADDER L (SET/KITS/TRAYS/PACK) ×2 IMPLANT
SET SUCTION IRRIG HYDROSURG (IRRIGATION / IRRIGATOR) ×2 IMPLANT
SET TRI-LUMEN FLTR TB AIRSEAL (TUBING) ×2 IMPLANT
SHEARS HARMONIC ACE PLUS 36CM (ENDOMECHANICALS) ×2 IMPLANT
SLEEVE SCD COMPRESS KNEE MED (STOCKING) ×2 IMPLANT
SUT VIC AB 3-0 PS2 18 (SUTURE) ×2
SUT VIC AB 3-0 PS2 18XBRD (SUTURE) ×2 IMPLANT
SUT VIC AB 4-0 KS 27 (SUTURE) IMPLANT
SUT VICRYL 0 UR6 27IN ABS (SUTURE) ×2 IMPLANT
SUT VLOC 180 0 9IN  GS21 (SUTURE)
SUT VLOC 180 0 9IN GS21 (SUTURE) ×2 IMPLANT
SYR 50ML LL SCALE MARK (SYRINGE) IMPLANT
SYSTEM CARTER THOMASON II (TROCAR) ×2 IMPLANT
TOWEL OR 17X24 6PK STRL BLUE (TOWEL DISPOSABLE) ×2 IMPLANT
TRAY FOLEY W/BAG SLVR 14FR LF (SET/KITS/TRAYS/PACK) ×2 IMPLANT
TROCAR 5MMX150MM (TROCAR) IMPLANT
TROCAR PORT AIRSEAL 5X120 (TROCAR) ×2 IMPLANT
TROCAR Z THRD FIOS 12X150 (TROCAR) IMPLANT
TROCAR Z-THREAD FIOS 11X100 BL (TROCAR) ×2 IMPLANT
TROCAR Z-THREAD FIOS 5X100MM (TROCAR) ×2 IMPLANT
WARMER LAPAROSCOPE (MISCELLANEOUS) ×2 IMPLANT

## 2022-05-07 NOTE — Op Note (Signed)
Surgeon: Eula Flax, MD Assistant: Cheri Fowler, MD Preoperative Diagnosis: AUB-HMB-L, morbid obesity Postoperative Diagnosis: Same as above Procedure: Total laparoscopic hysterectomy with bilateral salpingectomy; cystoscopy, repair of vaginal lacerations Anesthesia: GETA by Dr Sabra Heck IVF: 1000cc LR EBL: 50cc UOP: 150cc clear urine Complications: none Specimen: cervix, uterus and portions of bilateral fallopian tubes to pathology Findings: External genitalia show multiparous cervix with nabothian cyst at 12:00.  Bimanual exam limited by patient habitus.  However, able to manipulate large uterus consistent with known fibroid burden.  Excellent descensus with patient's sleep.  Upon insertion of laparoscope, multiple 5 to 6 cm fibroids noted consistent with in office scan, however bilateral round ligaments and portions of remaining fallopian tubes were easily visible with use of uterine manipulator.  Bilateral ovaries appear within normal limits.   Indications. Bethany Conley is a 52yo G4P 2113 with Aub-HMB-L who has failed medical management and desires surgical management in the form of hysterectomy.  TVUS showed Uterus 11.5x8.6x7.4cm, EMS 58mm with multiple polyps/hypervascularity, rt ov WNL, lf ov not seen, Multiple fibroids (5) with largest 5x6cm (ranging 2.5-6), IM x4 and SM x1. Cx with nabothian cysts    Operative Procedure: The patient was taken to the operating room where a time out was performed to confirm patient and correct procedure.  Please note that there was a delay in start of procedure secondary to contamination of initial operative tools.  The patient was given 3g Ancef in accordance with ACOG guidelines based on weight. General anesthesia was established. The patient was then positioned on the operating table in the dorsal lithotomy position with the legs supported using stirrups. All pressure points were padded and a Bair hugger was placed to maintain control of core body  temperature.  Please note that positioning of patient given her height and morbid obesity also added time and delay of start of procedure.   The patient was then prepped and draped in the usual sterile fashion. A Foley catheter was inserted in normal sterile fashion. An operative speculum was placed into the vagina. A tenaculum was placed on anterior cervical lip. The uterus sounded to 9cm and a V-care uterine manipulator (large cup) was placed in normal fashion with testing of bubble prior to placement and ensuring cup was placed circumferentially around cervix.    Attention was then turned the abdomen with 0.25% marcaine plain was injected subdermally infraumbilically. A 64mm vertical incision was made infraumbilically. A 63mm trochar and sleeve (long) were inserted through the incision using direct visualization with laparoscope. Once intraabdominal entry was confirmed. Pneumoperitoneum was established using Airseal. Subsequently, two other small incision were made in left and right lower quadrants, both 2cm above and 2cm medial to corresponding ASIS's. LLQ site 85mm (long trochar), RLQ site 2mm. Airseal port introduced in RLQ, 38mm trochar introduced in LLQ.    Attention was then turned to the left uterine fundus after laparoscopic survey showed findings as above. The left broad ligament was then grasped, coagulated and cut using Ligasure scalpel. Process was continued through mesosalpinx and utero-ovarian ligament. After using similar pattern to dissect through broad ligament, the Ligasure was used to coagulate and cut the left uterine artery. The process was then repeated on right aspect.  Bilateral fimbriated portions of fallopian tubes from prior ligation were also transected using LigaSure device and passed off the field to pathology.  Upon completion, uterus was thoroughly blanched and good hemostasis was noted. Bilateral cardinal ligaments were then ligated using Ligasure allowing for adequate  elevation using manipulator to  expose area for colpotomy.    The colpotomy incision was made using L-hook on Bovie and carried out circumferentially, taking care to amputate cervi from vagina without shortening the vaginal cuff.  Surgeon then turned attention to warts pelvis in order to assess whether or not vaginal morcellation was required for delivery of specimen into vaginal vault.  Patient had a pelvis that was proven to 10 pounds during her childbearing years.  With the aid of Deaver retraction as well as a long weighted speculum, the specimen was able to be delivered intact into the vagina with scope viewing from above to ensure no traction of bowel into the vagina.  Vaginal occluder placed at this time.   After suction-irrigation revealed excellent vaginal cuff hemostasis, 0-vicryl Endostitch was used to closed the vaginal cuff in running non-locked fashion. Good hemostasis noted upon completion. No gross evidence of injury to bladder or bowel noted. Carter-Thompson fascial closure device used to close LLQ 79mm port site after trochar was removed using 0-vicryl suture. Remaining trochars were removed without issue and pneumoperitoneum was evacuated. Skin was closed using 4-0 Monocryl and Dermabond overlying.   At this time attention turned towards cystoscopy at which time IV fluoroscein was administered by anesthesia.  Foley catheter removed, routine cystoscopy was carried out. Bilateral efflux noted from ureters.  No suture evident within the bladder.  Bubble sign positive. Foley catheter replaced.  For removal of the vaginal occluder, four very shallow small (less than 1 cm in length) vaginal lacerations were noted likely those that occurred during delivery of specimen into the vaginal vault.  These were repaired in routine fashion using a 2-0 Vicryl on a CT1 suture using figure of eights to achieve hemostasis.  Excellent hemostasis noted upon completion of procedure.    The patient was transferred  to recovery room in stable condition. All needle, sponge ad instrument counts were noted to be correct. x2 at end of procedure.

## 2022-05-07 NOTE — Anesthesia Preprocedure Evaluation (Signed)
Anesthesia Evaluation  Patient identified by MRN, date of birth, ID band Patient awake    Reviewed: Allergy & Precautions, H&P , NPO status , Patient's Chart, lab work & pertinent test results  Airway Mallampati: II  TM Distance: >3 FB Neck ROM: Full    Dental no notable dental hx.    Pulmonary neg pulmonary ROS   Pulmonary exam normal breath sounds clear to auscultation       Cardiovascular hypertension, Pt. on medications negative cardio ROS Normal cardiovascular exam Rhythm:Regular Rate:Normal     Neuro/Psych negative neurological ROS  negative psych ROS   GI/Hepatic negative GI ROS, Neg liver ROS,,,  Endo/Other    Morbid obesity  Renal/GU negative Renal ROS  negative genitourinary   Musculoskeletal  (+) Arthritis , Osteoarthritis,    Abdominal  (+) + obese  Peds negative pediatric ROS (+)  Hematology  (+) Blood dyscrasia, anemia   Anesthesia Other Findings   Reproductive/Obstetrics negative OB ROS                             Anesthesia Physical Anesthesia Plan  ASA: 3  Anesthesia Plan: General   Post-op Pain Management: Tylenol PO (pre-op)* and Dilaudid IV   Induction: Intravenous  PONV Risk Score and Plan: 3 and Ondansetron, Dexamethasone, Midazolam and Treatment may vary due to age or medical condition  Airway Management Planned: Oral ETT  Additional Equipment:   Intra-op Plan:   Post-operative Plan: Extubation in OR  Informed Consent: I have reviewed the patients History and Physical, chart, labs and discussed the procedure including the risks, benefits and alternatives for the proposed anesthesia with the patient or authorized representative who has indicated his/her understanding and acceptance.     Dental advisory given  Plan Discussed with: CRNA  Anesthesia Plan Comments:        Anesthesia Quick Evaluation

## 2022-05-07 NOTE — Anesthesia Procedure Notes (Signed)
Procedure Name: Intubation Date/Time: 05/07/2022 9:14 AM  Performed by: Rogers Blocker, CRNAPre-anesthesia Checklist: Patient identified, Emergency Drugs available, Suction available and Patient being monitored Patient Re-evaluated:Patient Re-evaluated prior to induction Oxygen Delivery Method: Circle System Utilized Preoxygenation: Pre-oxygenation with 100% oxygen Induction Type: IV induction Ventilation: Mask ventilation without difficulty Laryngoscope Size: Mac and 3 Grade View: Grade I Tube type: Oral Tube size: 7.0 mm Number of attempts: 1 Airway Equipment and Method: Stylet and Bite block Placement Confirmation: ETT inserted through vocal cords under direct vision, positive ETCO2 and breath sounds checked- equal and bilateral Secured at: 22 cm Tube secured with: Tape Dental Injury: Teeth and Oropharynx as per pre-operative assessment

## 2022-05-07 NOTE — Anesthesia Postprocedure Evaluation (Signed)
Anesthesia Post Note  Patient: Bethany Conley  Procedure(s) Performed: TOTAL LAPAROSCOPIC HYSTERECTOMY WITH SALPINGECTOMY (Bilateral) CYSTOSCOPY     Patient location during evaluation: PACU Anesthesia Type: General Level of consciousness: awake and alert Pain management: pain level controlled Vital Signs Assessment: post-procedure vital signs reviewed and stable Respiratory status: spontaneous breathing, nonlabored ventilation and respiratory function stable Cardiovascular status: blood pressure returned to baseline and stable Postop Assessment: no apparent nausea or vomiting Anesthetic complications: no   No notable events documented.  Last Vitals:  Vitals:   05/07/22 1300 05/07/22 1315  BP: (!) 157/93 (!) 153/86  Pulse: 71 66  Resp: 16 16  Temp: 36.4 C   SpO2: 93% 97%    Last Pain:  Vitals:   05/07/22 1300  TempSrc:   PainSc: [place]

## 2022-05-07 NOTE — Brief Op Note (Signed)
05/07/2022  12:01 PM  PATIENT:  Bethany Conley  52 y.o. female  PRE-OPERATIVE DIAGNOSIS:  abnormal uterine bleeding  POST-OPERATIVE DIAGNOSIS:  abnormal uterine bleeding  PROCEDURE:  Procedure(s): TOTAL LAPAROSCOPIC HYSTERECTOMY WITH SALPINGECTOMY (Bilateral) CYSTOSCOPY (N/A)Repair of vaginal lacerations  SURGEON:  Surgeon(s) and Role:    * Aniza Shor, Melida Quitter, MD - Primary    * Meisinger, Todd, MD - Assisting   ANESTHESIA:   general  EBL:  50 mL   BLOOD ADMINISTERED:none  DRAINS: none   LOCAL MEDICATIONS USED:  MARCAINE     SPECIMEN:  Source of Specimen:  bL portions of fallopian tubes, uterus, cervix  DISPOSITION OF SPECIMEN:  PATHOLOGY  COUNTS:  YES  TOURNIQUET:  * No tourniquets in log *   PLAN OF CARE: Admit for overnight observation  PATIENT DISPOSITION:  PACU - hemodynamically stable.   Delay start of Pharmacological VTE agent (>24hrs) due to surgical blood loss or risk of bleeding: yes

## 2022-05-07 NOTE — Transfer of Care (Signed)
Immediate Anesthesia Transfer of Care Note  Patient: Bethany Conley  Procedure(s) Performed: TOTAL LAPAROSCOPIC HYSTERECTOMY WITH SALPINGECTOMY (Bilateral) CYSTOSCOPY  Patient Location: PACU  Anesthesia Type:General  Level of Consciousness: awake, alert , oriented, and patient cooperative  Airway & Oxygen Therapy: Patient Spontanous Breathing  Post-op Assessment: Report given to RN and Post -op Vital signs reviewed and stable  Post vital signs: Reviewed and stable  Last Vitals:  Vitals Value Taken Time  BP 154/99 05/07/22 1215  Temp    Pulse 72 05/07/22 1219  Resp 14 05/07/22 1219  SpO2 96 % 05/07/22 1219  Vitals shown include unvalidated device data.  Last Pain:  Vitals:   05/07/22 0651  TempSrc: Oral  PainSc: 0-No pain      Patients Stated Pain Goal: 3 ([id number])  Complications: No notable events documented.

## 2022-05-07 NOTE — H&P (Signed)
Bethany Conley is an 52 y.o. female presenting for scheduled procedure. Since preop visit on 2/19, has not had menses (consistent with perimenopausal status). No questions today. Still endorses lower abdominal cramping, low back pain and pelvic "heaviness."  Pertinent Gynecological History: Menses: regular every month without intermenstrual spotting, with severe dysmenorrhea, and heavy flow for 6-7d Bleeding: dysfunctional uterine bleeding Contraception: tubal ligation DES exposure: denies Blood transfusions: none Sexually transmitted diseases: no past history Previous GYN Procedures: DNC (SAB requiring D&C) Last mammogram: normal Date: 03/25/2022 Last pap: normal  OB History: G4, P2113   Menstrual History: Menarche age: 23 Patient's last menstrual period was 03/05/2022 (approximate).    Past Medical History:  Diagnosis Date   Abnormal uterine bleeding (AUB)    Anemia    Arthritis    knees   Essential hypertension    Morbid obesity    Uterine leiomyoma     Past Surgical History:  Procedure Laterality Date   COLONOSCOPY WITH PROPOFOL  2021   TUBAL LIGATION Bilateral 2000   PPTL    Family History  Problem Relation Age of Onset   Breast cancer Neg Hx     Social History:  reports that she has never smoked. She has never used smokeless tobacco. She reports that she does not drink alcohol and does not use drugs.  Allergies:  Allergies  Allergen Reactions   Sulfa Antibiotics Rash    Medications Prior to Admission  Medication Sig Dispense Refill Last Dose   hydrochlorothiazide (HYDRODIURIL) 12.5 MG tablet Take 1 tablet (12.5 mg total) by mouth daily. (Patient taking differently: Take 12.5 mg by mouth daily.) 90 tablet 3 05/06/2022   Multiple Vitamins-Minerals (MULTIVIT/MULTIMINERAL ADULT) LIQD Take by mouth daily.   05/06/2022   naproxen sodium (ALEVE) 220 MG tablet Take 220 mg by mouth 2 (two) times daily as needed.   05/05/2022    Review of Systems  Constitutional:   Negative for chills and fever.  Respiratory:  Negative for shortness of breath.   Cardiovascular:  Negative for chest pain, palpitations and leg swelling.  Gastrointestinal:  Negative for abdominal pain, nausea and vomiting.  Genitourinary:  Positive for pelvic pain.  Musculoskeletal:  Positive for back pain.  Neurological:  Negative for dizziness, weakness and headaches.  Psychiatric/Behavioral:  Negative for suicidal ideas.     Blood pressure (!) 170/96, pulse 89, temperature 98.3 F (36.8 C), temperature source Oral, resp. rate 18, height 5\' 11"  (1.803 m), weight (!) 163.8 kg, last menstrual period 03/05/2022, SpO2 99 %.  Physical Exam Gen: NAD, AAOx3 CV: CTAB, TTT Abd: umbilical lap incision from BTL, suprapubic fundus, BS x4 GU deferred MSK: neg calf TTP BL Psych/Neuro: grossly WNL  EMBx: 2/19: WEAKLY PROLIFERATIVE PATTERN ENDOMETRIUM WITH FOCAL BREAKDOWN POSSIBLE ENDOMETRIAL POLYP NO EVIDENCE OF UNOPPOSED ESTROGEN EFFECT (HYPERPLASIA) OR MALIGNANCY  TVUS: Uterus 11.5x8.6x7.4cm, EMS 62mm with multiple polyps/hypervascularity, rt ov WNL, lf ov not seen, Multiple fibroids (5) with largest 5x6cm (ranging 2.5-6), IM x4 and SM x1. Cx with nabothian cysts  Results for orders placed or performed during the hospital encounter of 05/07/22 (from the past 24 hour(s))  Pregnancy, urine POC     Status: None   Collection Time: 05/07/22  6:33 AM  Result Value Ref Range   Preg Test, Ur NEGATIVE NEGATIVE    No results found.  Assessment/Plan: This is a 52yo [id number] s/p BTL presenting for scheduled TLH/BS/cysto for Aub-HMB-L. PMHx s/f BMI 50, CHTN on Rx, BL knee arthritis, chronic anemia Preop Hgb 10.7, on  HCTZ 12.5mg  Risks of TLH include infection of the uterus, pelvic organs, or skin, inadvertent injury to internal organs, such as bowel or bladder. If there is major injury, extensive surgery may be required. If injury is minor, it may be treated with relative ease. Discussed possibility  of excessive blood loss and transfusion. Patient aware that no future fertility will remain after procedure. Patient accepts the possibility of blood transfusion, if necessary. Bowel and/or bladder injury may require prolonged inpatient stay and possible colostomy, Foley catheter, etc, as deemed fit by other surgeon. Also discussed possibility of mini-lap/converting to open procedure if deemed necessary. TXA planned given chronic anemia. Patient understands and agrees to move forward with surgery.   [place] 05/07/2022, 8:00 AM

## 2022-05-08 ENCOUNTER — Encounter (HOSPITAL_BASED_OUTPATIENT_CLINIC_OR_DEPARTMENT_OTHER): Payer: Self-pay | Admitting: Obstetrics and Gynecology

## 2022-05-08 DIAGNOSIS — N939 Abnormal uterine and vaginal bleeding, unspecified: Secondary | ICD-10-CM | POA: Diagnosis not present

## 2022-05-08 MED ORDER — IBUPROFEN 800 MG PO TABS: 800.0000 mg | ORAL_TABLET | Freq: Three times a day (TID) | ORAL | 1 refills | Status: DC | PRN

## 2022-05-08 MED ORDER — KETOROLAC TROMETHAMINE 30 MG/ML IJ SOLN
INTRAMUSCULAR | Status: AC
  Filled 2022-05-08: qty 1

## 2022-05-08 MED ORDER — ACETAMINOPHEN 500 MG PO TABS
ORAL_TABLET | ORAL | Status: AC
  Filled 2022-05-08: qty 2

## 2022-05-08 MED ORDER — OXYCODONE HCL 5 MG PO TABS
ORAL_TABLET | ORAL | Status: AC
  Filled 2022-05-08: qty 2

## 2022-05-08 MED ORDER — OXYCODONE HCL 5 MG PO TABS
5.0000 mg | ORAL_TABLET | Freq: Four times a day (QID) | ORAL | 0 refills | Status: DC | PRN
Start: 2022-05-08 — End: 2022-07-23

## 2022-05-08 NOTE — Discharge Summary (Signed)
Physician Discharge Summary  Patient ID: Bethany Conley MRN: [id number] DOB/AGE: [birth year] 52 y.o.  Admit date: 05/07/2022 Discharge date: 05/08/2022  Admission Diagnoses:  Discharge Diagnoses:  Principal Problem:   Abnormal uterine bleeding (AUB)   Discharged Condition: good  Hospital Course: Admitted for scheduled TLH/Bs/cysto for AUB-HMB-L. Uncomplicated procedure, please see operative note for full details. By POD#1, ambulating, tolerating PO, voiding, pain controlled on PO meds. Discharged home in stable fashion with routine precautions   Consults: None  Discharge Exam: Blood pressure 137/82, pulse 90, temperature 98.2 F (36.8 C), resp. rate 20, height 5\' 11"  (1.803 m), weight (!) 163.8 kg, last menstrual period 03/05/2022, SpO2 95 %. General: alert, cooperative and no distress Lochia:normal flow Chest: CTAB Heart: RRR no m/r/g Abdomen: +BS, soft, nontender. Lap sites x3 CDI with overlying Dermabond GU: peripad dry Extremities: neg edema, neg calf TTP BL, neg Homans BL  Disposition: Discharge disposition: 01-Home or Self Care       Discharge Instructions     Call MD for:  extreme fatigue   Complete by: As directed    Call MD for:  persistant dizziness or light-headedness   Complete by: As directed    Call MD for:  persistant nausea and vomiting   Complete by: As directed    Call MD for:  redness, tenderness, or signs of infection (pain, swelling, redness, odor or green/yellow discharge around incision site)   Complete by: As directed    Call MD for:  severe uncontrolled pain   Complete by: As directed    Call MD for:  temperature >100.4   Complete by: As directed    Diet - low sodium heart healthy   Complete by: As directed    Increase activity slowly   Complete by: As directed    Lifting restrictions   Complete by: As directed    No greater that 15lbs   No wound care   Complete by: As directed    Sexual Activity Restrictions   Complete by: As  directed    None for 6-8wks      Allergies as of 05/08/2022       Reactions   Sulfa Antibiotics Rash        Medication List     STOP taking these medications    naproxen sodium 220 MG tablet Commonly known as: ALEVE       TAKE these medications    hydrochlorothiazide 12.5 MG tablet Commonly known as: HYDRODIURIL Take 1 tablet (12.5 mg total) by mouth daily.   ibuprofen 800 MG tablet Commonly known as: ADVIL Take 1 tablet (800 mg total) by mouth every 8 (eight) hours as needed.   Multivit/Multimineral Adult Liqd Take by mouth daily.   oxyCODONE 5 MG immediate release tablet Commonly known as: Oxy IR/ROXICODONE Take 1 tablet (5 mg total) by mouth every 6 (six) hours as needed for severe pain.        Follow-up Information     Azlaan Isidore, Melida Quitter, MD. Go in 2 week(s).   Specialty: Obstetrics and Gynecology Contact information: [address] [place] [address] [phone]                 Signed: [place] 05/08/2022, 8:03 AM

## 2022-05-08 NOTE — Progress Notes (Signed)
POD #1  Subjective:  No acute events overnight.  Pt denies problems with ambulating, voiding or po intake.  She denies nausea or vomiting.  Pain is well controlled.  She has had flatus. She has not had bowel movement. No VB per patient.   Objective: Blood pressure 137/82, pulse 90, temperature 98.2 F (36.8 C), resp. rate 20, height 5\' 11"  (1.803 m), weight (!) 163.8 kg, last menstrual period 03/05/2022, SpO2 95 %.  Physical Exam:  General: alert, cooperative and no distress Lochia:normal flow Chest: CTAB Heart: RRR no m/r/g Abdomen: +BS, soft, nontender. Lap sites x3 CDI with overlying Dermabond GU: peripad dry Extremities: neg edema, neg calf TTP BL, neg Homans BL  Recent Labs    05/06/22 0932 05/07/22 1503  HGB 10.2* 10.3*  HCT 33.0* 33.6*    Assessment/Plan:  ASSESSMENT: Bethany Conley is a 52 y.o. [id number] s/p TLH/BS/cysto.vaginal lac repair for AUB-HMB-L. PMHx s/f BMI 50. CHTN on Rx.   -Continue PO pain meds -Encourage ambulation and incentive spirometry -DC home today with 2wk incision check   LOS: 0 days

## 2022-05-10 LAB — SURGICAL PATHOLOGY

## 2022-07-23 ENCOUNTER — Ambulatory Visit: Payer: 59 | Admitting: Nurse Practitioner

## 2022-07-23 ENCOUNTER — Encounter: Payer: Self-pay | Admitting: Nurse Practitioner

## 2022-07-23 VITALS — BP 124/80 | HR 77 | Temp 98.3°F | Ht 71.0 in | Wt 352.2 lb

## 2022-07-23 DIAGNOSIS — R232 Flushing: Secondary | ICD-10-CM | POA: Diagnosis not present

## 2022-07-23 DIAGNOSIS — Z6841 Body Mass Index (BMI) 40.0 and over, adult: Secondary | ICD-10-CM

## 2022-07-23 DIAGNOSIS — I10 Essential (primary) hypertension: Secondary | ICD-10-CM | POA: Diagnosis not present

## 2022-07-23 DIAGNOSIS — Z23 Encounter for immunization: Secondary | ICD-10-CM

## 2022-07-23 DIAGNOSIS — Z7689 Persons encountering health services in other specified circumstances: Secondary | ICD-10-CM

## 2022-07-23 LAB — CMP14+EGFR
ALT: 20 IU/L (ref 0–32)
AST: 27 IU/L (ref 0–40)
Albumin: 4.1 g/dL (ref 3.8–4.9)
Alkaline Phosphatase: 130 IU/L — ABNORMAL HIGH (ref 44–121)
BUN/Creatinine Ratio: 17 (ref 9–23)
BUN: 16 mg/dL (ref 6–24)
Bilirubin Total: 0.3 mg/dL (ref 0.0–1.2)
CO2: 23 mmol/L (ref 20–29)
Calcium: 9.6 mg/dL (ref 8.7–10.2)
Chloride: 103 mmol/L (ref 96–106)
Creatinine, Ser: 0.94 mg/dL (ref 0.57–1.00)
Globulin, Total: 3.8 g/dL (ref 1.5–4.5)
Glucose: 94 mg/dL (ref 70–99)
Potassium: 4.3 mmol/L (ref 3.5–5.2)
Sodium: 140 mmol/L (ref 134–144)
Total Protein: 7.9 g/dL (ref 6.0–8.5)
eGFR: 73 mL/min/{1.73_m2} (ref >59)

## 2022-07-23 MED ORDER — HYDROCHLOROTHIAZIDE 12.5 MG PO TABS: 12.5000 mg | ORAL_TABLET | Freq: Every day | ORAL | 3 refills | Status: DC

## 2022-07-23 MED ORDER — HYDROCHLOROTHIAZIDE 12.5 MG PO TABS: 12.5000 mg | ORAL_TABLET | Freq: Every day | ORAL | 1 refills | Status: DC

## 2022-07-23 NOTE — Progress Notes (Signed)
Madelaine Bhat, CMA,acting as a Neurosurgeon for Arnette Felts, FNP.,have documented all relevant documentation on the behalf of Arnette Felts, FNP,as directed by  Arnette Felts, FNP while in the presence of Arnette Felts, FNP.  Subjective:  Patient ID: Bethany Conley , female    DOB: 1971/01/06 , 52 y.o.   MRN: [id number]  Chief Complaint  Patient presents with   Establish Care    HPI  Patient presents today to establish care, she had been seen at [facility] (one month) was going to [place] prior to that for a couple of years. She works for the [place] [employer]. Single and has 3 children (2 live local and one child out of the house).   PMH - HTN (8 years),   patient would like to discuss hot flashes. Patient reports having for months, she reports they happen everyday about 2/3 times a day. Patient is established with a orthopedic surgeon. Patient reports hysterectomy in April 2024.  Patient reports her old PCP left her previous practice. BP Readings from Last 3 Encounters: 07/23/22 : 124/80 05/08/22 : (!) 147/85 05/06/22 : (!) 140/90   She does not eat a lot of red meat, eats a lot of fried foods. She admits to loving sweets. She had coffee this morning but will have 3 cups per day.      Past Medical History:  Diagnosis Date   Abnormal uterine bleeding (AUB)    Anemia    Arthritis    knees   Essential hypertension    Morbid obesity (HCC)    Uterine leiomyoma      Family History  Problem Relation Age of Onset   Cancer Mother    Diabetes Father    Breast cancer Neg Hx      Current Outpatient Medications:    Multiple Vitamins-Minerals (MULTIVIT/MULTIMINERAL ADULT) LIQD, Take by mouth daily., Disp: , Rfl:    hydrochlorothiazide (HYDRODIURIL) 12.5 MG tablet, Take 1 tablet (12.5 mg total) by mouth daily., Disp: 90 tablet, Rfl: 1   Allergies  Allergen Reactions   Sulfa Antibiotics Rash     Review of Systems  Constitutional: Negative.   Respiratory: Negative.     Cardiovascular: Negative.   Neurological: Negative.   Psychiatric/Behavioral: Negative.       Today's Vitals   07/23/22 1036  BP: 124/80  Pulse: 77  Temp: 98.3 F (36.8 C)  TempSrc: Oral  Weight: (!) 352 lb 3.2 oz (159.8 kg)  Height: 5\' 11"  (1.803 m)  PainSc: 0-No pain   Body mass index is 49.12 kg/m.  Wt Readings from Last 3 Encounters:  07/23/22 (!) 352 lb 3.2 oz (159.8 kg)  05/07/22 (!) 361 lb 3.2 oz (163.8 kg)  05/06/22 (!) 356 lb (161.5 kg)    The ASCVD Risk score (Arnett DK, et al., 2019) failed to calculate for the following reasons:   Cannot find a previous HDL lab   Cannot find a previous total cholesterol lab  Objective:  Physical Exam Vitals reviewed.  Constitutional:      Appearance: She is well-developed.  HENT:     Head: Normocephalic and atraumatic.  Eyes:     Pupils: Pupils are equal, round, and reactive to light.  Cardiovascular:     Rate and Rhythm: Normal rate and regular rhythm.     Pulses: Normal pulses.     Heart sounds: Normal heart sounds. No murmur heard. Pulmonary:     Effort: Pulmonary effort is normal. No respiratory distress.  Breath sounds: Normal breath sounds. No wheezing.  Musculoskeletal:        General: Normal range of motion.  Skin:    General: Skin is warm and dry.     Capillary Refill: Capillary refill takes less than 2 seconds.  Neurological:     General: No focal deficit present.     Mental Status: She is alert and oriented to person, place, and time.     Cranial Nerves: No cranial nerve deficit.  Psychiatric:        Mood and Affect: Mood normal.         Assessment And Plan:  1. Essential hypertension Comments: Blood pressure is controlled, continue current medications - CMP14+EGFR - hydrochlorothiazide (HYDRODIURIL) 12.5 MG tablet; Take 1 tablet (12.5 mg total) by mouth daily.  Dispense: 90 tablet; Refill: 1  2. Hot flashes Comments: Discussed limiting intake of sugar and carbs particularly late at night.  Increase physical activity.  3. Need for zoster vaccination Comments: Shingrix #1 administered in office - Zoster Recombinant (Shingrix )  4. Morbid obesity with BMI of 45.0-49.9, adult [facility]) Comments: she has lost 9 lbs since April. Encouraged to exercise regularly and eat healthy diet  5. Establishing care with new doctor, encounter for Patient is here to establish care. Went over patient medical, family, social and surgical history. Reviewed with patient their medications and any allergies  Reviewed with patient their sexual orientation, drug/tobacco and alcohol use Dicussed any new concerns with patient  recommended patient comes in for a physical exam and complete blood work.  Educated patient about the importance of annual screenings and immunizations.  Advised patient to eat a healthy diet along with exercise for atleast 30-45 min atleast 4-5 days of the week.    Return for uncontrolled bp 3-41months check; HM in Feb 2025.  Patient was given opportunity to ask questions. Patient verbalized understanding of the plan and was able to repeat key elements of the plan. All questions were answered to their satisfaction.  Arnette Felts, FNP  I, Arnette Felts, FNP, have reviewed all documentation for this visit. The documentation on 07/23/22 for the exam, diagnosis, procedures, and orders are all accurate and complete.   IF YOU HAVE BEEN REFERRED TO A SPECIALIST, IT MAY TAKE 1-2 WEEKS TO SCHEDULE/PROCESS THE REFERRAL. IF YOU HAVE NOT HEARD FROM US/SPECIALIST IN TWO WEEKS, PLEASE GIVE [place] A CALL AT [phone].

## 2022-11-11 ENCOUNTER — Encounter: Payer: Self-pay | Admitting: Nurse Practitioner

## 2022-11-11 ENCOUNTER — Ambulatory Visit: Payer: 59 | Admitting: Nurse Practitioner

## 2022-11-11 VITALS — BP 112/60 | HR 83 | Temp 98.6°F | Ht 68.8 in | Wt 357.2 lb

## 2022-11-11 DIAGNOSIS — E66813 Obesity, class 3: Secondary | ICD-10-CM

## 2022-11-11 DIAGNOSIS — Z6841 Body Mass Index (BMI) 40.0 and over, adult: Secondary | ICD-10-CM

## 2022-11-11 DIAGNOSIS — I10 Essential (primary) hypertension: Secondary | ICD-10-CM | POA: Diagnosis not present

## 2022-11-11 DIAGNOSIS — Z2821 Immunization not carried out because of patient refusal: Secondary | ICD-10-CM

## 2022-11-11 DIAGNOSIS — Z79899 Other long term (current) drug therapy: Secondary | ICD-10-CM

## 2022-11-11 DIAGNOSIS — Z13220 Encounter for screening for lipoid disorders: Secondary | ICD-10-CM

## 2022-11-11 DIAGNOSIS — Z23 Encounter for immunization: Secondary | ICD-10-CM

## 2022-11-11 LAB — LIPID PANEL
Chol/HDL Ratio: 2.8 {ratio} (ref 0.0–4.4)
Cholesterol, Total: 203 mg/dL — ABNORMAL HIGH (ref 100–199)
HDL: 73 mg/dL (ref >39)
LDL Chol Calc (NIH): 116 mg/dL — ABNORMAL HIGH (ref 0–99)
Triglycerides: 76 mg/dL (ref 0–149)
VLDL Cholesterol Cal: 14 mg/dL (ref 5–40)

## 2022-11-11 LAB — CBC
Hematocrit: 35.3 % (ref 34.0–46.6)
Hemoglobin: 11.6 g/dL (ref 11.1–15.9)
MCH: 30.2 pg (ref 26.6–33.0)
MCHC: 32.9 g/dL (ref 31.5–35.7)
MCV: 92 fL (ref 79–97)
Platelets: 377 10*3/uL (ref 150–450)
RBC: 3.84 x10E6/uL (ref 3.77–5.28)
RDW: 12.6 % (ref 11.7–15.4)
WBC: 8 10*3/uL (ref 3.4–10.8)

## 2022-11-11 LAB — BASIC METABOLIC PANEL
BUN/Creatinine Ratio: 18 (ref 9–23)
BUN: 14 mg/dL (ref 6–24)
CO2: 22 mmol/L (ref 20–29)
Calcium: 9.5 mg/dL (ref 8.7–10.2)
Chloride: 101 mmol/L (ref 96–106)
Creatinine, Ser: 0.8 mg/dL (ref 0.57–1.00)
Glucose: 84 mg/dL (ref 70–99)
Potassium: 4.3 mmol/L (ref 3.5–5.2)
Sodium: 138 mmol/L (ref 134–144)
eGFR: 88 mL/min/{1.73_m2} (ref >59)

## 2022-11-11 LAB — HEMOGLOBIN A1C
Est. average glucose Bld gHb Est-mCnc: 103 mg/dL
Hgb A1c MFr Bld: 5.2 % (ref 4.8–5.6)

## 2022-11-11 NOTE — Patient Instructions (Signed)
Goal to exercise 150 minutes per week with at least 2 days of strength training as tolerated Encouraged to park further when at the store, take stairs instead of elevators and to walk in place during commercials. Increase water intake to at least one gallon of water daily. I do encourage you to increase your intake of protein (at least 46 grams per day). You really need to eat more foods particularly protein and vegetables.  Consider meal prep options When you don't eat enough this can hinder your weight loss. See how you do over the next few months until your appt in February.

## 2022-11-11 NOTE — Assessment & Plan Note (Addendum)
Chronic Discussed healthy diet and regular exercise options  Encouraged to exercise at least 150 minutes per week with 2 days of strength training I am cautious with starting her on a weight loss medication due to not eating a lot already, I don't want to suppress her completely. Explained she needs to eat more protein and vegetables.  I will also send her for a sleep study to ensure this is not affecting her cardiovascular status or weight loss

## 2022-11-11 NOTE — Progress Notes (Signed)
I,Jameka J Llittleton, CMA,acting as a Neurosurgeon for [employer], FNP.,have documented all relevant documentation on the behalf of Arnette Felts, FNP,as directed by  Arnette Felts, FNP while in the presence of Arnette Felts, FNP.  Subjective:  Patient ID: Bethany Conley , female    DOB: [birth year] , 52 y.o.   MRN: [id number]  Chief Complaint  Patient presents with   Hypertension    HPI  Patient presents today for a bp check. Patient reports compliance with her meds. Patient doesn't have any questions or concerns at this time.  Breakfast - banana Lunch - chick fil a salad Dinner - cereal - whole grain cereal   Hypertension This is a chronic problem. The current episode started more than 1 year ago. The problem has been gradually improving since onset. The problem is controlled. Pertinent negatives include no shortness of breath. Risk factors for coronary artery disease include obesity and sedentary lifestyle (she has bone on bone to her knees bilaterally. She is taking Aleve up to 4 tablets per day that may last for one week). Past treatments include diuretics. There are no compliance problems.  There is no history of angina. There is no history of sleep apnea.     Past Medical History:  Diagnosis Date   Abnormal uterine bleeding (AUB)    Anemia    Arthritis    knees   Essential hypertension    Morbid obesity (HCC)    Uterine leiomyoma      Family History  Problem Relation Age of Onset   Cancer Mother    Diabetes Father    Breast cancer Neg Hx      Current Outpatient Medications:    hydrochlorothiazide (HYDRODIURIL) 12.5 MG tablet, Take 1 tablet (12.5 mg total) by mouth daily., Disp: 90 tablet, Rfl: 1   Multiple Vitamins-Minerals (MULTIVIT/MULTIMINERAL ADULT) LIQD, Take by mouth daily., Disp: , Rfl:    Allergies  Allergen Reactions   Sulfa Antibiotics Rash     Review of Systems  Constitutional: Negative.   HENT: Negative.    Respiratory:  Negative for apnea and  shortness of breath.   Cardiovascular: Negative.   Gastrointestinal: Negative.   Genitourinary: Negative.   Psychiatric/Behavioral: Negative.       Today's Vitals   11/11/22 0906  BP: 112/60  Pulse: 83  Temp: 98.6 F (37 C)  Weight: (!) 357 lb 3.2 oz (162 kg)  Height: 5' 8.8" (1.748 m)  PainSc: 0-No pain   Body mass index is 53.06 kg/m.  Wt Readings from Last 3 Encounters:  11/11/22 (!) 357 lb 3.2 oz (162 kg)  07/23/22 (!) 352 lb 3.2 oz (159.8 kg)  05/07/22 (!) 361 lb 3.2 oz (163.8 kg)    The ASCVD Risk score (Arnett DK, et al., 2019) failed to calculate for the following reasons:   Cannot find a previous HDL lab   Cannot find a previous total cholesterol lab  Objective:  Physical Exam Vitals reviewed.  Constitutional:      Appearance: She is well-developed.  HENT:     Head: Normocephalic and atraumatic.  Eyes:     Pupils: Pupils are equal, round, and reactive to light.  Cardiovascular:     Rate and Rhythm: Normal rate and regular rhythm.     Pulses: Normal pulses.     Heart sounds: Normal heart sounds. No murmur heard. Pulmonary:     Effort: Pulmonary effort is normal. No respiratory distress.     Breath sounds: Normal breath sounds. No wheezing.  Musculoskeletal:        General: Normal range of motion.  Skin:    General: Skin is warm and dry.     Capillary Refill: Capillary refill takes less than 2 seconds.  Neurological:     General: No focal deficit present.     Mental Status: She is alert and oriented to person, place, and time.     Cranial Nerves: No cranial nerve deficit.  Psychiatric:        Mood and Affect: Mood normal.        Behavior: Behavior normal.        Thought Content: Thought content normal.        Judgment: Judgment normal.         Assessment And Plan:  Essential hypertension Assessment & Plan: Blood pressure is well controlled, continue current medications.   Orders: -     Basic metabolic panel  Need for influenza  vaccination -     Flu vaccine trivalent PF, 6mos and older(Flulaval,Afluria,Fluarix,Fluzone)  Class 3 severe obesity due to excess calories with body mass index (BMI) of 50.0 to 59.9 in adult, unspecified whether serious comorbidity present (HCC) -     Ambulatory referral to Sleep Studies -     Hemoglobin A1c  Herpes zoster vaccination declined  Tetanus, diphtheria, and acellular pertussis (Tdap) vaccination declined  COVID-19 vaccination declined  Other long term (current) drug therapy -     CBC  Encounter for screening for lipid disorder -     Lipid panel  Morbid obesity with BMI of 45.0-49.9, adult (HCC) Assessment & Plan: Chronic Discussed healthy diet and regular exercise options  Encouraged to exercise at least 150 minutes per week with 2 days of strength training I am cautious with starting her on a weight loss medication due to not eating a lot already, I don't want to suppress her completely. Explained she needs to eat more protein and vegetables.  I will also send her for a sleep study to ensure this is not affecting her cardiovascular status or weight loss     Return if symptoms worsen or fail to improve, for keep next appt as scheduled .   Patient was given opportunity to ask questions. Patient verbalized understanding of the plan and was able to repeat key elements of the plan. All questions were answered to their satisfaction.    Jeanell Sparrow, FNP, have reviewed all documentation for this visit. The documentation on 11/11/22 for the exam, diagnosis, procedures, and orders are all accurate and complete.   IF YOU HAVE BEEN REFERRED TO A SPECIALIST, IT MAY TAKE 1-2 WEEKS TO SCHEDULE/PROCESS THE REFERRAL. IF YOU HAVE NOT HEARD FROM US/SPECIALIST IN TWO WEEKS, PLEASE GIVE [place] A CALL AT [phone].

## 2022-11-11 NOTE — Assessment & Plan Note (Signed)
Blood pressure is well controlled, continue current medications.

## 2022-12-23 ENCOUNTER — Encounter: Payer: Self-pay | Admitting: Neurology

## 2022-12-23 ENCOUNTER — Ambulatory Visit (INDEPENDENT_AMBULATORY_CARE_PROVIDER_SITE_OTHER): Payer: 59 | Admitting: Neurology

## 2022-12-23 VITALS — BP 126/88 | HR 79 | Ht 71.0 in | Wt 361.0 lb

## 2022-12-23 DIAGNOSIS — R351 Nocturia: Secondary | ICD-10-CM

## 2022-12-23 DIAGNOSIS — R0683 Snoring: Secondary | ICD-10-CM

## 2022-12-23 DIAGNOSIS — Z9189 Other specified personal risk factors, not elsewhere classified: Secondary | ICD-10-CM

## 2022-12-23 DIAGNOSIS — Z820 Family history of epilepsy and other diseases of the nervous system: Secondary | ICD-10-CM | POA: Diagnosis not present

## 2022-12-23 DIAGNOSIS — G4700 Insomnia, unspecified: Secondary | ICD-10-CM

## 2022-12-23 DIAGNOSIS — R519 Headache, unspecified: Secondary | ICD-10-CM

## 2022-12-23 DIAGNOSIS — Z6841 Body Mass Index (BMI) 40.0 and over, adult: Secondary | ICD-10-CM

## 2022-12-23 NOTE — Patient Instructions (Signed)

## 2022-12-23 NOTE — Progress Notes (Signed)
Subjective:    Patient ID: Bethany Conley is a 52 y.o. female.  HPI    [name], MD, PhD [facility] [address] [address] [place], [place] [address]  Dear Lolita Cram,  I saw your patient, Bethany Conley, on your kind request in my sleep clinic today for initial consultation of her sleep disorder, in particular, concern for underlying obstructive sleep apnea.  The patient is unaccompanied today.  As you know, Bethany Conley is a 52 year old female with an underlying medical history of hypertension, anemia, abnormal uterine bleeding with uterine leiomyoma, status post hysterectomy in April 2024, anemia, arthritis, and morbid obesity with a BMI of over 50, who reports chronic difficulty initiating and maintaining sleep, nonrestorative sleep and rare morning headaches.  She is single and lives alone, she has 3 grown children.  She is not sure if she snores.  Epworth sleepiness score is 7 out of 24, fatigue severity score is 41 out of 63.  She believes her sister has sleep apnea and possibly a PAP machine.  Patient works for the [employer].  She goes to bed generally is somewhere between 11 PM and 1 AM and rise time is generally between 7 to.  He is a non-smoker.  She does not currently drink any alcohol.  She drinks caffeine in the form of coffee, about 4 cups/day.  She has had trouble losing weight.  She has arthritis and has seen orthopedics.  She may need knee surgery but does need to work on weight loss.  She has no pets in the house.  She does have a TV in her bedroom and it tends to stay on all night.   I reviewed your office note from 11/11/2022.  She wakes up generally once per night to go to the bathroom.  She has rarely woken up with a headache in the morning.   Her Past Medical History Is Significant For: Past Medical History:  Diagnosis Date   Abnormal uterine bleeding (AUB)    Anemia    Arthritis    knees   Essential hypertension    Morbid  obesity (HCC)    Uterine leiomyoma     Her Past Surgical History Is Significant For: Past Surgical History:  Procedure Laterality Date   COLONOSCOPY WITH PROPOFOL  2021   CYSTOSCOPY N/A 05/07/2022   Procedure: CYSTOSCOPY;  Surgeon: Carlisle Cater, MD;  Location: [facility];  Service: Gynecology;  Laterality: N/A;   TOTAL LAPAROSCOPIC HYSTERECTOMY WITH SALPINGECTOMY Bilateral 05/07/2022   Procedure: TOTAL LAPAROSCOPIC HYSTERECTOMY WITH SALPINGECTOMY;  Surgeon: Carlisle Cater, MD;  Location: [facility];  Service: Gynecology;  Laterality: Bilateral;   TUBAL LIGATION Bilateral 2000   PPTL    Her Family History Is Significant For: Family History  Problem Relation Age of Onset   Cancer Mother    Diabetes Father    Breast cancer Neg Hx     Her Social History Is Significant For: Social History   Socioeconomic History   Marital status: Single    Spouse name: Not on file   Number of children: Not on file   Years of education: Not on file   Highest education level: Not on file  Occupational History    Employer: [employer]  Tobacco Use   Smoking status: Never   Smokeless tobacco: Never  Vaping Use   Vaping status: Never Used  Substance and Sexual Activity   Alcohol use: No   Drug  use: Never   Sexual activity: Not Currently    Birth control/protection: Surgical  Other Topics Concern   Not on file  Social History Narrative   Caffiene coffee 4 cups daily.   Work Environmental manager day shift.    Live home children 3 sons (grown)   Social Determinants of Health   Financial Resource Strain: Not on file  Food Insecurity: Not on file  Transportation Needs: Not on file  Physical Activity: Not on file  Stress: Not on file  Social Connections: Unknown (02/26/2022)   Received from [facility], [facility]   Social Network    Social Network: Not on file    Her Allergies Are:  Allergies  Allergen Reactions   Sulfa Antibiotics Rash   :   Her Current Medications Are:  Outpatient Encounter Medications as of 12/23/2022  Medication Sig   hydrochlorothiazide (HYDRODIURIL) 12.5 MG tablet Take 1 tablet (12.5 mg total) by mouth daily.   Multiple Vitamins-Minerals (MULTIVIT/MULTIMINERAL ADULT) LIQD Take by mouth daily.   No facility-administered encounter medications on file as of 12/23/2022.  :   Review of Systems:  Out of a complete 14 point review of systems, all are reviewed and negative with the exception of these symptoms as listed below:   Review of Systems  Neurological:        Obesity, disfragmented sleep. Hard time losing weight.   No SS. Or eval for OSA. Some napping during day (mainly Sunday). Works a lot.  ESS 7 FSS 41.     Objective:  Neurological Exam  Physical Exam Physical Examination:   Vitals:   12/23/22 1012  BP: 126/88  Pulse: 79  SpO2: 99%    General Examination: The patient is a very pleasant 52 y.o. female in no acute distress. She appears well-developed and well-nourished and well groomed.   HEENT: Normocephalic, atraumatic, pupils are equal, round and reactive to light, extraocular tracking is good without limitation to gaze excursion or nystagmus noted. Hearing is grossly intact. Face is symmetric with normal facial animation. Speech is clear with no dysarthria noted. There is no hypophonia. There is no lip, neck/head, jaw or voice tremor. Neck is supple with full range of passive and active motion. There are no carotid bruits on auscultation. Oropharynx exam reveals: mild mouth dryness, good dental hygiene and mild to moderate airway crowding, due to small airway entry.  Mallampati class II.  Tonsils of 2+ on the right and 1+ on the left.  Minimal overbite noted.  Tongue protrudes centrally and palate elevates symmetrically.   Chest: Clear to auscultation without wheezing, rhonchi or crackles noted.  Heart: S1+S2+0, regular and normal without murmurs, rubs or gallops noted.    Abdomen: Soft, non-tender and non-distended.  Extremities: There is trace pitting edema in the distal lower extremities bilaterally.   Skin: Warm and dry without trophic changes noted.   Musculoskeletal: exam reveals no obvious joint deformities.   Neurologically:  Mental status: The patient is awake, alert and oriented in all 4 spheres. Her immediate and remote memory, attention, language skills and fund of knowledge are appropriate. There is no evidence of aphasia, agnosia, apraxia or anomia. Speech is clear with normal prosody and enunciation. Thought process is linear. Mood is normal and affect is normal.  Cranial nerves II - XII are as described above under HEENT exam.  Motor exam: Normal bulk, strength and tone is noted. There is no obvious action or resting tremor.  Fine motor skills and coordination: grossly intact.  Cerebellar testing: No dysmetria or intention tremor. There is no truncal or gait ataxia.  Sensory exam: intact to light touch in the upper and lower extremities.  Gait, station and balance: She stands easily. No veering to one side is noted. No leaning to one side is noted. Posture is age-appropriate and stance is narrow based. Gait shows normal stride length and normal pace. No problems turning are noted.   Assessment and Plan:  In summary, Bethany Conley is a very pleasant 52 y.o.-year old female with an underlying medical history of hypertension, anemia, abnormal uterine bleeding with uterine leiomyoma, status post hysterectomy in April 2024, anemia, arthritis, and morbid obesity with a BMI of over 50, whose history and physical exam are concerning for sleep disordered breathing, particularly obstructive sleep apnea (OSA). A laboratory attended sleep study is typically considered "gold standard" for evaluation of sleep disordered breathing.   I had a long chat with the patient about my findings and the diagnosis of sleep apnea, particularly OSA, its prognosis and  treatment options. We talked about medical/conservative treatments, surgical interventions and non-pharmacological approaches for symptom control. I explained, in particular, the risks and ramifications of untreated moderate to severe OSA, especially with respect to developing cardiovascular disease down the road, including congestive heart failure (CHF), difficult to treat hypertension, cardiac arrhythmias (particularly A-fib), neurovascular complications including TIA, stroke and dementia. Even type 2 diabetes has, in part, been linked to untreated OSA. Symptoms of untreated OSA may include (but may not be limited to) daytime sleepiness, nocturia (i.e. frequent nighttime urination), memory problems, mood irritability and suboptimally controlled or worsening mood disorder such as depression and/or anxiety, lack of energy, lack of motivation, physical discomfort, as well as recurrent headaches, especially morning or nocturnal headaches. We talked about the importance of maintaining a healthy lifestyle and striving for healthy weight. In addition, we talked about the importance of striving for and maintaining good sleep hygiene. I recommended a sleep study at this time. I outlined the differences between a laboratory attended sleep study which is considered more comprehensive and accurate over the option of a home sleep test (HST); the latter may lead to underestimation of sleep disordered breathing in some instances and does not help with diagnosing upper airway resistance syndrome and is not accurate enough to diagnose primary central sleep apnea typically. I outlined possible surgical and non-surgical treatment options of OSA, including the use of a positive airway pressure (PAP) device (i.e. CPAP, AutoPAP/APAP or BiPAP in certain circumstances), a custom-made dental device (aka oral appliance, which would require a referral to a specialist dentist or orthodontist typically, and is generally speaking not  considered for patients with full dentures or edentulous state), upper airway surgical options, such as traditional UPPP (which is not considered a first-line treatment) or the Inspire device (hypoglossal nerve stimulator, which would involve a referral for consultation with an ENT surgeon, after careful selection, following inclusion criteria - also not first-line treatment). I explained the PAP treatment option to the patient in detail, as this is generally considered first-line treatment.  The patient indicated that she would be willing to try PAP therapy, if the need arises. I explained the importance of being compliant with PAP treatment, not only for insurance purposes but primarily to improve patient's symptoms symptoms, and for the patient's long term health benefit, including to reduce Her cardiovascular risks longer-term.    We will pick up our discussion about the next steps and treatment options after testing.  We will keep  her posted as to the test results by phone call and/or MyChart messaging where possible.  We will plan to follow-up in sleep clinic accordingly as well.  I answered all her questions today and the patient was in agreement.   I encouraged her to call with any interim questions, concerns, problems or updates or email [place] through MyChart.  Generally speaking, sleep test authorizations may take up to 2 weeks, sometimes less, sometimes longer, the patient is encouraged to get in touch with [place] if they do not hear back from the sleep lab staff directly within the next 2 weeks.  Thank you very much for allowing me to participate in the care of this nice patient. If I can be of any further assistance to you please do not hesitate to call me at [phone].  Sincerely,   [name], MD, PhD

## 2023-01-15 ENCOUNTER — Telehealth: Payer: Self-pay | Admitting: Neurology

## 2023-01-15 NOTE — Telephone Encounter (Signed)
LVM for pt to call back to schedule   NPGS & HST UMR no auth req spoke to [place] ref # [id number]

## 2023-01-23 NOTE — Telephone Encounter (Signed)
Patient returned my call.  [employer] BCBS no auth req spoke to [place] ref # [id number].  Patient is scheduled at [facility] for 03/09/23 at 9 pm.  Mailed packet to the patient.

## 2023-02-19 NOTE — Telephone Encounter (Signed)
 Updated insurance info  [employer] UMR no auth req ref # [id number] on 02/19/23 at 8:32 AM Central time   Patient is scheduled at [facility] for 03/09/23 at 9 pm.

## 2023-03-03 NOTE — Telephone Encounter (Signed)
Patient called and stated she was not going to be able to make it for 03/09/23 for her SS.  She is r/s for 04/13/23 at 9 pm.  Mailed new packet to the patient.

## 2023-03-10 ENCOUNTER — Encounter: Payer: Self-pay | Admitting: Nurse Practitioner

## 2023-03-10 ENCOUNTER — Ambulatory Visit (INDEPENDENT_AMBULATORY_CARE_PROVIDER_SITE_OTHER): Payer: Commercial Managed Care - PPO | Admitting: Nurse Practitioner

## 2023-03-10 VITALS — BP 110/80 | HR 87 | Temp 97.8°F | Ht 71.0 in | Wt 359.4 lb

## 2023-03-10 DIAGNOSIS — R0981 Nasal congestion: Secondary | ICD-10-CM

## 2023-03-10 DIAGNOSIS — I10 Essential (primary) hypertension: Secondary | ICD-10-CM

## 2023-03-10 DIAGNOSIS — Z2821 Immunization not carried out because of patient refusal: Secondary | ICD-10-CM

## 2023-03-10 DIAGNOSIS — M25562 Pain in left knee: Secondary | ICD-10-CM

## 2023-03-10 DIAGNOSIS — Z0000 Encounter for general adult medical examination without abnormal findings: Secondary | ICD-10-CM | POA: Diagnosis not present

## 2023-03-10 DIAGNOSIS — E7800 Pure hypercholesterolemia, unspecified: Secondary | ICD-10-CM | POA: Diagnosis not present

## 2023-03-10 DIAGNOSIS — E66813 Obesity, class 3: Secondary | ICD-10-CM

## 2023-03-10 DIAGNOSIS — R232 Flushing: Secondary | ICD-10-CM

## 2023-03-10 DIAGNOSIS — Z6841 Body Mass Index (BMI) 40.0 and over, adult: Secondary | ICD-10-CM

## 2023-03-10 DIAGNOSIS — M25561 Pain in right knee: Secondary | ICD-10-CM

## 2023-03-10 DIAGNOSIS — Z79899 Other long term (current) drug therapy: Secondary | ICD-10-CM

## 2023-03-10 LAB — POCT URINALYSIS DIP (CLINITEK)
Bilirubin, UA: NEGATIVE
Blood, UA: NEGATIVE
Glucose, UA: NEGATIVE mg/dL
Ketones, POC UA: NEGATIVE mg/dL
Leukocytes, UA: NEGATIVE
Nitrite, UA: NEGATIVE
POC PROTEIN,UA: NEGATIVE
Spec Grav, UA: 1.015 (ref 1.010–1.025)
Urobilinogen, UA: 0.2 U/dL
pH, UA: 6 (ref 5.0–8.0)

## 2023-03-10 MED ORDER — PHENTERMINE HCL 15 MG PO CAPS
15.0000 mg | ORAL_CAPSULE | ORAL | 1 refills | Status: DC
Start: 2023-03-10 — End: 2023-05-08

## 2023-03-10 MED ORDER — FLUTICASONE PROPIONATE 50 MCG/ACT NA SUSP
2.0000 | Freq: Every day | NASAL | 2 refills | Status: DC
Start: 2023-03-10 — End: 2023-06-05

## 2023-03-10 MED ORDER — DICLOFENAC SODIUM 1 % EX GEL
2.0000 g | Freq: Four times a day (QID) | CUTANEOUS | 2 refills | Status: AC
Start: 2023-03-10 — End: ?

## 2023-03-10 NOTE — Progress Notes (Unsigned)
Madelaine Bhat, CMA,acting as a Neurosurgeon for Arnette Felts, FNP.,have documented all relevant documentation on the behalf of Arnette Felts, FNP,as directed by  Arnette Felts, FNP while in the presence of Arnette Felts, FNP.  Subjective:    Patient ID: Bethany Conley , female    DOB: Dec 26, 1970 , 53 y.o.   MRN: [id number]  Chief Complaint  Patient presents with   Annual Exam    HPI  Patient presents today for HM, Patient reports compliance with medication. Patient denies any chest pain, SOB, or headaches. Patient has no concerns today. Patient denies any marks or spots on legs and would like to keep her pants on.   She has seen her Orthopedic provider last year and had an orthopedic and had injections. He is recommending surgery for her knees. She is trying to lose weight. He has given Tramadol - she is not taking much but is using ibuprofen.      Past Medical History:  Diagnosis Date   Abnormal uterine bleeding (AUB)    Anemia    Arthritis    knees   Essential hypertension    Morbid obesity (HCC)    Uterine leiomyoma      Family History  Problem Relation Age of Onset   Cancer Mother    Diabetes Father    Breast cancer Neg Hx      Current Outpatient Medications:    diclofenac Sodium (VOLTAREN) 1 % GEL, Apply 2 g topically 4 (four) times daily., Disp: 100 g, Rfl: 2   hydrochlorothiazide (HYDRODIURIL) 12.5 MG tablet, Take 1 tablet (12.5 mg total) by mouth daily., Disp: 90 tablet, Rfl: 1   Multiple Vitamins-Minerals (MULTIVIT/MULTIMINERAL ADULT) LIQD, Take by mouth daily., Disp: , Rfl:    phentermine 15 MG capsule, Take 1 capsule (15 mg total) by mouth every morning., Disp: 30 capsule, Rfl: 1   traMADol (ULTRAM) 50 MG tablet, Take 50 mg by mouth 3 (three) times daily as needed., Disp: , Rfl:    Allergies  Allergen Reactions   Sulfa Antibiotics Rash      The patient states she uses status post hysterectomy for birth control. No LMP recorded (lmp unknown). Patient has had a  hysterectomy..  Negative for: breast discharge, breast lump(s), breast pain and breast self exam. Associated symptoms include abnormal vaginal bleeding. Pertinent negatives include abnormal bleeding (hematology), anxiety, decreased libido, depression, difficulty falling sleep, dyspareunia, history of infertility, nocturia, sexual dysfunction, sleep disturbances, urinary incontinence, urinary urgency, vaginal discharge and vaginal itching. Diet regular; she is doing lettuce wraps, protein shakes. She will eat grill chicken in the evening.  She seldomly snacks. She will eat belvita packs in between. The patient states her exercise level is minimal - she will try to walk but has pain. 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  She is scheduled for a Sleep Study on March 9th this had to rescheduled. She has not been on any weight loss medications.   The patient's tobacco use is:  Social History   Tobacco Use  Smoking Status Never  Smokeless Tobacco Never  . She has been exposed to passive smoke. The patient's alcohol use is:  Social History   Substance and Sexual Activity  Alcohol Use No  . Additional information: Last pap ***, next one scheduled for ***.    Review of Systems  Constitutional: Negative.   HENT: Negative.    Respiratory:  Negative for apnea and shortness of breath.   Cardiovascular: Negative.  Gastrointestinal: Negative.   Genitourinary: Negative.   Psychiatric/Behavioral: Negative.       Today's Vitals   03/10/23 1015  BP: 110/80  Pulse: 87  Temp: 97.8 F (36.6 C)  TempSrc: Oral  Weight: (!) 359 lb 6.4 oz (163 kg)  Height: 5\' 11"  (1.803 m)  PainSc: 5   PainLoc: Knee   Body mass index is 50.13 kg/m.  Wt Readings from Last 3 Encounters:  03/10/23 (!) 359 lb 6.4 oz (163 kg)  12/23/22 (!) 361 lb (163.7 kg)  11/11/22 (!) 357 lb 3.2 oz (162 kg)     Objective:  Physical Exam Vitals reviewed.         Assessment And Plan:     Encounter for  annual health examination  Essential hypertension -     EKG 12-Lead -     POCT URINALYSIS DIP (CLINITEK) -     Microalbumin / creatinine urine ratio -     CMP14+EGFR  Hot flashes  Class 3 severe obesity due to excess calories with body mass index (BMI) of 50.0 to 59.9 in adult, unspecified whether serious comorbidity present (HCC)  COVID-19 vaccination declined  Herpes zoster vaccination declined  Morbid obesity with BMI of 50.0-59.9, adult (HCC) -     Phentermine HCl; Take 1 capsule (15 mg total) by mouth every morning.  Dispense: 30 capsule; Refill: 1  Other long term (current) drug therapy -     CBC with Differential/Platelet  Elevated cholesterol -     Lipid panel  Acute pain of both knees -     Diclofenac Sodium; Apply 2 g topically 4 (four) times daily.  Dispense: 100 g; Refill: 2     Return for 1 year physical, 6 month bp check. Patient was given opportunity to ask questions. Patient verbalized understanding of the plan and was able to repeat key elements of the plan. All questions were answered to their satisfaction.   Arnette Felts, FNP  I, Arnette Felts, FNP, have reviewed all documentation for this visit. The documentation on 03/10/23 for the exam, diagnosis, procedures, and orders are all accurate and complete.

## 2023-03-11 LAB — TSH: TSH: 1.1 u[IU]/mL (ref 0.450–4.500)

## 2023-03-11 LAB — CBC WITH DIFFERENTIAL/PLATELET
Basophils Absolute: 0.1 10*3/uL (ref 0.0–0.2)
Basos: 1 %
EOS (ABSOLUTE): 0.2 10*3/uL (ref 0.0–0.4)
Eos: 2 %
Hematocrit: 35.7 % (ref 34.0–46.6)
Hemoglobin: 11.8 g/dL (ref 11.1–15.9)
Immature Grans (Abs): 0.1 10*3/uL (ref 0.0–0.1)
Immature Granulocytes: 1 %
Lymphocytes Absolute: 2.5 10*3/uL (ref 0.7–3.1)
Lymphs: 26 %
MCH: 30.4 pg (ref 26.6–33.0)
MCHC: 33.1 g/dL (ref 31.5–35.7)
MCV: 92 fL (ref 79–97)
Monocytes Absolute: 0.9 10*3/uL (ref 0.1–0.9)
Monocytes: 9 %
Neutrophils Absolute: 6 10*3/uL (ref 1.4–7.0)
Neutrophils: 61 %
Platelets: 368 10*3/uL (ref 150–450)
RBC: 3.88 x10E6/uL (ref 3.77–5.28)
RDW: 12.1 % (ref 11.7–15.4)
WBC: 9.6 10*3/uL (ref 3.4–10.8)

## 2023-03-11 LAB — CMP14+EGFR
ALT: 13 [IU]/L (ref 0–32)
AST: 17 [IU]/L (ref 0–40)
Albumin: 4.1 g/dL (ref 3.8–4.9)
Alkaline Phosphatase: 124 [IU]/L — ABNORMAL HIGH (ref 44–121)
BUN/Creatinine Ratio: 18 (ref 9–23)
BUN: 14 mg/dL (ref 6–24)
Bilirubin Total: 0.3 mg/dL (ref 0.0–1.2)
CO2: 25 mmol/L (ref 20–29)
Calcium: 9.3 mg/dL (ref 8.7–10.2)
Chloride: 100 mmol/L (ref 96–106)
Creatinine, Ser: 0.76 mg/dL (ref 0.57–1.00)
Globulin, Total: 3.6 g/dL (ref 1.5–4.5)
Glucose: 88 mg/dL (ref 70–99)
Potassium: 4.6 mmol/L (ref 3.5–5.2)
Sodium: 137 mmol/L (ref 134–144)
Total Protein: 7.7 g/dL (ref 6.0–8.5)
eGFR: 94 mL/min/{1.73_m2} (ref >59)

## 2023-03-11 LAB — LIPID PANEL
Chol/HDL Ratio: 3.1 {ratio} (ref 0.0–4.4)
Cholesterol, Total: 208 mg/dL — ABNORMAL HIGH (ref 100–199)
HDL: 67 mg/dL (ref >39)
LDL Chol Calc (NIH): 125 mg/dL — ABNORMAL HIGH (ref 0–99)
Triglycerides: 90 mg/dL (ref 0–149)
VLDL Cholesterol Cal: 16 mg/dL (ref 5–40)

## 2023-03-11 LAB — MICROALBUMIN / CREATININE URINE RATIO
Creatinine, Urine: 93.4 mg/dL
Microalb/Creat Ratio: 6 mg/g{creat} (ref 0–29)
Microalbumin, Urine: 6 ug/mL

## 2023-03-12 ENCOUNTER — Encounter: Payer: Self-pay | Admitting: Nurse Practitioner

## 2023-03-16 DIAGNOSIS — E66813 Obesity, class 3: Secondary | ICD-10-CM | POA: Insufficient documentation

## 2023-03-16 DIAGNOSIS — E7800 Pure hypercholesterolemia, unspecified: Secondary | ICD-10-CM | POA: Insufficient documentation

## 2023-03-16 DIAGNOSIS — R0981 Nasal congestion: Secondary | ICD-10-CM | POA: Insufficient documentation

## 2023-03-16 DIAGNOSIS — M25561 Pain in right knee: Secondary | ICD-10-CM | POA: Insufficient documentation

## 2023-03-16 DIAGNOSIS — Z0000 Encounter for general adult medical examination without abnormal findings: Secondary | ICD-10-CM | POA: Insufficient documentation

## 2023-03-16 DIAGNOSIS — Z2821 Immunization not carried out because of patient refusal: Secondary | ICD-10-CM | POA: Insufficient documentation

## 2023-03-16 NOTE — Assessment & Plan Note (Signed)

## 2023-03-16 NOTE — Assessment & Plan Note (Signed)
 Declines shingrix, educated on disease process and is aware if he changes his mind to notify office

## 2023-03-16 NOTE — Assessment & Plan Note (Signed)
 She is encouraged to take an over the counter menopause supplement to include Estroven

## 2023-03-16 NOTE — Assessment & Plan Note (Signed)
 Flonase  nasal spray sent to pharmacy has mild turbinate swelling

## 2023-03-16 NOTE — Assessment & Plan Note (Signed)
 Blood pressure is well controlled, continue current medications.

## 2023-03-16 NOTE — Assessment & Plan Note (Signed)
 She is encouraged to strive for BMI less than 30 to decrease cardiac risk. Advised to aim for at least 150 minutes of exercise per week. Will start her on phentermine , discussed side effects to include increased heart rate, dry mouth and insomnia. EKG done NSR 78

## 2023-03-16 NOTE — Assessment & Plan Note (Signed)

## 2023-03-16 NOTE — Assessment & Plan Note (Signed)
 Mild crepitus to bilateral knees, prescription for diclofenac  gel.

## 2023-03-16 NOTE — Assessment & Plan Note (Signed)
 Cholesterol levels are stable. Continue focusing on healthy diet

## 2023-04-13 ENCOUNTER — Ambulatory Visit: Payer: Commercial Managed Care - PPO | Admitting: Neurology

## 2023-04-13 DIAGNOSIS — G4720 Circadian rhythm sleep disorder, unspecified type: Secondary | ICD-10-CM

## 2023-04-13 DIAGNOSIS — R519 Headache, unspecified: Secondary | ICD-10-CM

## 2023-04-13 DIAGNOSIS — R0683 Snoring: Secondary | ICD-10-CM

## 2023-04-13 DIAGNOSIS — R351 Nocturia: Secondary | ICD-10-CM

## 2023-04-13 DIAGNOSIS — G4700 Insomnia, unspecified: Secondary | ICD-10-CM

## 2023-04-13 DIAGNOSIS — Z820 Family history of epilepsy and other diseases of the nervous system: Secondary | ICD-10-CM

## 2023-04-13 DIAGNOSIS — Z9189 Other specified personal risk factors, not elsewhere classified: Secondary | ICD-10-CM

## 2023-04-13 DIAGNOSIS — G4733 Obstructive sleep apnea (adult) (pediatric): Secondary | ICD-10-CM | POA: Diagnosis not present

## 2023-04-18 NOTE — Procedures (Signed)
 Physician Interpretation:     [facility] POLYSOMNOGRAPHY  INTERPRETATION REPORT   STUDY DATE:  04/13/2023     PATIENT NAME:  Bethany Conley         DATE OF BIRTH:  [birth year]  PATIENT ID:  [id number]    TYPE OF STUDY:  PSG  READING PHYSICIAN: [name], MD, PhD   SCORING TECHNICIAN: Margaretann Loveless, RPSGT     Referred by: Arnette Felts, FNP  ? History and Indication for Testing: 53 year old female with an underlying medical history of hypertension, anemia, abnormal uterine bleeding with uterine leiomyoma, status post hysterectomy in April 2024, anemia, arthritis, and morbid obesity with a BMI of over 50, who reports chronic difficulty initiating and maintaining sleep, nonrestorative sleep and rare morning headaches.  Height: 71 in Weight: 361 lb (BMI 50) Neck Size: 0 in    MEDICATIONS: Hydrodiuril, Multiple Vitamins-Minerals   TECHNICAL DESCRIPTION: A registered sleep technologist  was in attendance for the duration of the recording.  Data collection, scoring, video monitoring, and reporting were performed in compliance with the AASM Manual for the Scoring of Sleep and Associated Events; (Hypopnea is scored based on the criteria listed in Section VIII D. 1b in the AASM Manual V2.6 using a 4% oxygen desaturation rule or Hypopnea is scored based on the criteria listed in Section VIII D. 1a in the AASM Manual V2.6 using 3% oxygen desaturation and /or arousal rule).   SLEEP CONTINUITY AND SLEEP ARCHITECTURE:  Lights-out was at 21:52: and lights-on at  05:14:, with a total  recording time of 7 hours, 21.5 min. Total sleep time ( TST) was 220.0 minutes with a decreased sleep efficiency at 49.8%. There was  20.5% REM sleep.  BODY POSITION:  TST was divided  between the following sleep positions: 64.5% supine;  35.5% lateral;  0% prone. Duration of total sleep and percent of total sleep in their respective position is as follows: supine 142 minutes (65%),  non-supine 78 minutes (35%); right 00 minutes (0%), left 78 minutes (35%), and prone 00 minutes (0%).  Total supine REM sleep time was 45 minutes (100% of total REM sleep).  Sleep latency was increased at 85.0 minutes.  REM sleep latency was mildly below normal at 61.5 minutes. Of the total sleep time, the percentage of stage N1 sleep was 5.2%, stage N2 sleep was 65%, which is increased, stage N3 sleep was 9.3%, which is reduced, and REM sleep was 20.5%, which is normal.  There were 3 Stage R periods observed on this study night, 13 awakenings (i.e. transitions to Stage W from any sleep stage), and 47 total stage transitions. Wake after sleep onset (WASO) time accounted for .   RESPIRATORY MONITORING:   Based on CMS criteria (using a 4% oxygen desaturation rule for scoring hypopneas), there were 0 apneas (0 obstructive; 0 central; 0 mixed), and 37 hypopneas.  Apnea index was 0.0. Hypopnea index was 10.1. The apnea-hypopnea index was 10.1 overall (14.8 supine, 0 non-supine; 38.7 REM, 38.7 supine REM).  There were 0 respiratory effort-related arousals (RERAs).  The RERA index was 0 events/h. Total respiratory disturbance index (RDI) was 10.1 events/h. RDI results showed: supine RDI  14.8 /h; non-supine RDI 1.5 /h; REM RDI 38.7 /h, supine REM RDI 38.7 /h.   Based on AASM criteria (using a 3% oxygen desaturation and /or arousal rule for scoring hypopneas), there were 0 apneas (0 obstructive; 0 central; 0 mixed), and 54 hypopneas. Apnea index was 0.0. Hypopnea index was  14.7. The apnea-hypopnea index was 14.7/hour overall (21.1 supine, 0 non-supine; 50.7 REM, 50.7 supine REM).  There were 0 respiratory effort-related arousals (RERAs).  The RERA index was 0 events/h. Total respiratory disturbance index (RDI) was 14.7 events/h. RDI results showed: supine RDI  21.1 /h; non-supine RDI 3.1 /h; REM RDI 50.7 /h, supine REM RDI 50.7 /h.    OXIMETRY: Oxyhemoglobin Saturation Nadir during sleep was at  84% from a mean  of 93%.  Of the Total sleep time (TST)   hypoxemia (=<88%) was present for  1.0 minutes, or 0.5% of total sleep time.  LIMB MOVEMENTS: There were 0 periodic limb movements of sleep (0.0/hr), of which 0 (0.0/hr) were associated with an arousal.   AROUSAL: There were 39 arousals in total, for an arousal index of 11 arousals/hour.  Of these, 14 were identified as respiratory-related arousals (4 /h), 0 were PLM-related arousals (0 /h), and 31 were non-specific arousals (8 /h).    EEG: Review of the EEG showed no abnormal electrical discharges and symmetrical bihemispheric findings.    EKG: The EKG revealed normal sinus rhythm (NSR). The average heart rate during sleep was 79 bpm.   AUDIO/VIDEO REVIEW: The audio and video review did not show any abnormal or unusual behaviors, movements, phonations or vocalizations. The patient took one restroom break. Snoring was noted, mostly in the mild range.  POST-STUDY QUESTIONNAIRE: Post study, the patient indicated, that sleep was worse than usual.    IMPRESSION:  1. Obstructive Sleep Apnea (OSA) 2. Dysfunctions associated with sleep stages or arousal from sleep   RECOMMENDATIONS:   1. This study demonstrates overall mild obstructive sleep apnea, much more pronounced during REM sleep with a total AHI of 14.7/h, REM AHI of 50.7/h, supine AHI of 21.1/h, and O2 nadir of 84% (during supine REM sleep). Given the patient's medical history and sleep related complaints, treatment with positive airway pressure is recommended; this can be achieved in the form of autoPAP. A full-night CPAP titration study would allow optimization of therapy if needed, down the road. Other treatment options may include avoidance of supine sleep position along with weight loss (where clinically feasible and applicable), or the use of an oral appliance in selected patients.  Generally speaking, surgical treatment options are not recommended for mild obstructive sleep apnea. Please note, that  untreated obstructive sleep apnea may carry additional perioperative morbidity. Patients with significant obstructive sleep apnea should receive perioperative PAP therapy and the surgeons and particularly the anesthesiologist should be informed of the diagnosis and the severity of the sleep disordered breathing. 2. This study shows mild to moderate sleep fragmentation and mildly abnormal sleep stage percentages; these are nonspecific findings and per se do not signify an intrinsic sleep disorder or a cause for the patient's sleep-related symptoms. Causes include (but are not limited to) the first night effect of the sleep study, circadian rhythm disturbances, medication effect or an underlying mood disorder or medical problem.  3. The patient should be cautioned not to drive, work at heights, or operate dangerous or heavy equipment when tired or sleepy. Review and reiteration of good sleep hygiene measures should be pursued with any patient. 4. The patient will be seen in follow-up in the Sleep Clinic at [facility] for discussion of the test results, progress with treatment, and recheck on symptoms as well as potential further management strategies. The referring provider and the patient will be notified of the test results. I certify that I have reviewed the entire raw data recording  prior to the issuance of this report in accordance with the Standards of Accreditation of the American Academy of Sleep Medicine (AASM).  [name], MD, PhD Medical Director, [facility]) Diplomat, ABPN (Neurology and Sleep)               Technical Report:   General Information  Name: Bethany Conley, Bethany Conley BMI: 50.35 Physician: [name], MD  ID: [id number] Height: 71.0 in Technician: Margaretann Loveless, RPSGT  Sex: Female Weight: 361.0 lb Record: [id number]  Age: 53 [Mar 11, 1970] Date: 04/13/2023    Medical & Medication History    53 year old female with an underlying medical  history of hypertension, anemia, abnormal uterine bleeding with uterine leiomyoma, status post hysterectomy in April 2024, anemia, arthritis, and morbid obesity with a BMI of over 50, who reports chronic difficulty initiating and maintaining sleep, nonrestorative sleep and rare morning headaches. Hydrodiuril, Multiple Vitamins-Minerals   Sleep Disorder      Comments   The patient came into the sleep lab for a PSG. No nighttime medications. One restroom break. EKG did not show any obvious cardiac arrhythmias. Mild snoring. Respiratory events scored with a 3% desat. Majority of respiratory events while supine in REM. All sleep stages witnessed. The patient slept supine and lateral. The patient had a period of WASO. AHI was 8.0 after 2 hrs of TST this was not achieved until 3:32 AM.     Lights out: 09:52:43 PM Lights on: 05:14:08 AM   Time Total Supine Side Prone Upright  Recording (TRT) 7h 21.60m 3h 29.72m 3h 52.38m 0h 0.17m 0h 0.19m  Sleep (TST) 3h 40.61m 2h 22.71m 1h 18.54m 0h 0.52m 0h 0.58m   Latency N1 N2 N3 REM Onset Per. Slp. Eff.  Actual 0h 0.63m 0h 2.83m 0h 21.23m 1h 1.65m 1h 25.32m 1h 25.21m 49.83%   Stg Dur Wake N1 N2 N3 REM  Total 221.5 11.5 143.0 20.5 45.0  Supine 67.5 2.5 94.5 0.0 45.0  Side 154.0 9.0 48.5 20.5 0.0  Prone 0.0 0.0 0.0 0.0 0.0  Upright 0.0 0.0 0.0 0.0 0.0   Stg % Wake N1 N2 N3 REM  Total 50.2 5.2 65.0 9.3 20.5  Supine 15.3 1.1 43.0 0.0 20.5  Side 34.9 4.1 22.0 9.3 0.0  Prone 0.0 0.0 0.0 0.0 0.0  Upright 0.0 0.0 0.0 0.0 0.0     Apnea Summary Sub Supine Side Prone Upright  Total 0 Total 0 0 0 0 0    REM 0 0 0 0 0    NREM 0 0 0 0 0  Obs 0 REM 0 0 0 0 0    NREM 0 0 0 0 0  Mix 0 REM 0 0 0 0 0    NREM 0 0 0 0 0  Cen 0 REM 0 0 0 0 0    NREM 0 0 0 0 0   Rera Summary Sub Supine Side Prone Upright  Total 0 Total 0 0 0 0 0    REM 0 0 0 0 0    NREM 0 0 0 0 0   Hypopnea Summary Sub Supine Side Prone Upright  Total 54 Total 54 50 4 0 0    REM 38 38 0 0 0    NREM 16 12 4  0  0   4% Hypopnea Summary Sub Supine Side Prone Upright  Total (4%) 37 Total 37 35 2 0 0    REM 29 29 0 0 0    NREM 8  6 2 0 0     AHI Total Obs Mix Cen  14.73 Apnea 0.00 0.00 0.00 0.00   Hypopnea 14.73 -- -- --  10.09 Hypopnea (4%) 10.09 -- -- --    Total Supine Side Prone Upright  Position AHI 14.73 21.13 3.08 0.00 0.00  REM AHI 50.67   NREM AHI 5.49   Position RDI 14.73 21.13 3.08 0.00 0.00  REM RDI 50.67   NREM RDI 5.49    4% Hypopnea Total Supine Side Prone Upright  Position AHI (4%) 10.09 14.79 1.54 0.00 0.00  REM AHI (4%) 38.67   NREM AHI (4%) 2.74   Position RDI (4%) 10.09 14.79 1.54 0.00 0.00  REM RDI (4%) 38.67   NREM RDI (4%) 2.74    Desaturation Information Threshold: 2% <100% <90% <80% <70% <60% <50% <40%  Supine 85.0 18.0 0.0 0.0 0.0 0.0 0.0  Side 50.0 1.0 0.0 0.0 0.0 0.0 0.0  Prone 0.0 0.0 0.0 0.0 0.0 0.0 0.0  Upright 0.0 0.0 0.0 0.0 0.0 0.0 0.0  Total 135.0 19.0 0.0 0.0 0.0 0.0 0.0  Index 22.8 3.2 0.0 0.0 0.0 0.0 0.0   Threshold: 3% <100% <90% <80% <70% <60% <50% <40%  Supine 58.0 18.0 0.0 0.0 0.0 0.0 0.0  Side 28.0 1.0 0.0 0.0 0.0 0.0 0.0  Prone 0.0 0.0 0.0 0.0 0.0 0.0 0.0  Upright 0.0 0.0 0.0 0.0 0.0 0.0 0.0  Total 86.0 19.0 0.0 0.0 0.0 0.0 0.0  Index 14.5 3.2 0.0 0.0 0.0 0.0 0.0   Threshold: 4% <100% <90% <80% <70% <60% <50% <40%  Supine 38.0 15.0 0.0 0.0 0.0 0.0 0.0  Side 10.0 1.0 0.0 0.0 0.0 0.0 0.0  Prone 0.0 0.0 0.0 0.0 0.0 0.0 0.0  Upright 0.0 0.0 0.0 0.0 0.0 0.0 0.0  Total 48.0 16.0 0.0 0.0 0.0 0.0 0.0  Index 8.1 2.7 0.0 0.0 0.0 0.0 0.0   Threshold: 3% <100% <90% <80% <70% <60% <50% <40%  Supine 58 18 0 0 0 0 0  Side 28 1 0 0 0 0 0  Prone 0 0 0 0 0 0 0  Upright 0 0 0 0 0 0 0  Total 86 19 0 0 0 0 0   Awakening/Arousal Information # of Awakenings 13  Wake after sleep onset 136.75m  Wake after persistent sleep 136.51m   Arousal Assoc. Arousals Index  Apneas 0 0.0  Hypopneas 14 3.8  Leg Movements 0 0.0  Snore 0 0.0  PTT Arousals 0  0.0  Spontaneous 32 8.7  Total 46 12.5  Leg Movement Information PLMS LMs Index  Total LMs during PLMS 0 0.0  LMs w/ Microarousals 0 0.0   LM LMs Index  w/ Microarousal 0 0.0  w/ Awakening 0 0.0  w/ Resp Event 0 0.0  Spontaneous 0 0.0  Total 0 0.0     Desaturation threshold setting: 3% Minimum desaturation setting: 10 seconds SaO2 nadir: 83% The longest event was a 73 sec obstructive Hypopnea with a minimum SaO2 of 92%. The lowest SaO2 was 84% associated with a 21 sec obstructive Hypopnea. EKG Rates EKG Avg Max Min  Awake 83 106 70  Asleep 79 95 73  EKG Events: Tachycardia

## 2023-04-18 NOTE — Addendum Note (Signed)
 Addended by: [name] on: 04/18/2023 01:57 PM   Modules accepted: Orders

## 2023-04-24 ENCOUNTER — Telehealth: Payer: Self-pay | Admitting: *Deleted

## 2023-04-24 NOTE — Telephone Encounter (Signed)
 Called pt and LVM asking for call back to discuss results. Left office number in message.

## 2023-04-24 NOTE — Telephone Encounter (Signed)
-----   Message from [name] sent at 04/18/2023  1:57 PM EDT ----- Patient referred by PCP, seen by me on 12/23/22, diagnostic PSG on 04/13/23.    Please call and notify the patient that the recent sleep study showed obstructive sleep apnea. OSA is overall mild, but gets much worse during dream sleep. It is worth treating to see if she feels better after treatment. To that end I recommend treatment for this in the form of autoPAP, which means, that we don't have to bring her back for a second sleep study with CPAP, but will let him try an autoPAP machine at home, through a DME company (of her choice, or as per insurance requirement). The DME representative will educate her on how to use the machine, how to put the mask on, etc. I have placed an order in the chart. Please send referral, talk to patient, send report to referring MD. We will need a FU in sleep clinic for 10 weeks post-PAP set up, please arrange that with me or one of our NPs. Thanks,   [name], MD, PhD [facility])

## 2023-04-30 ENCOUNTER — Telehealth: Payer: Self-pay | Admitting: *Deleted

## 2023-04-30 NOTE — Telephone Encounter (Signed)
-----   Message from [name] sent at 04/18/2023  1:57 PM EDT ----- Patient referred by PCP, seen by me on 12/23/22, diagnostic PSG on 04/13/23.    Please call and notify the patient that the recent sleep study showed obstructive sleep apnea. OSA is overall mild, but gets much worse during dream sleep. It is worth treating to see if she feels better after treatment. To that end I recommend treatment for this in the form of autoPAP, which means, that we don't have to bring her back for a second sleep study with CPAP, but will let him try an autoPAP machine at home, through a DME company (of her choice, or as per insurance requirement). The DME representative will educate her on how to use the machine, how to put the mask on, etc. I have placed an order in the chart. Please send referral, talk to patient, send report to referring MD. We will need a FU in sleep clinic for 10 weeks post-PAP set up, please arrange that with me or one of our NPs. Thanks,   [name], MD, PhD [facility])

## 2023-04-30 NOTE — Telephone Encounter (Signed)
 Pt called and I relayed the results of her sleep study mild OSA (worse in dream sleep).  She is ok to proceed.  I told her that she does not need to come in for anymore additional testing at this time.  Recommendations for autopap use 4hr minimum every night.  DME co to  call her within a week, she will let us know if she has not heard.  [employer] [facility].  (I gave her the [place] #) She will speak to DME then let us know and call back to schedule initial f/u.   I sent message to [employer].

## 2023-05-01 NOTE — Telephone Encounter (Signed)
 ew, Doristine Mango, RN; Tomasa Rand; 1 other Received, thank you!     Previous Messages    ----- Message ----- From: Guy Begin, RN Sent: 04/30/2023   9:03 AM EDT To: Kathyrn Sheriff; Santina Evans; Kathe Becton; * Subject: new autopap user                              New autopap user  Bethany Conley Female, 53 y.o., 31-Jul-1970 MRN: [id number] Phone: [phone]   She lives in [place], and works in [employer] [place] Ii gave her the [employer] #). Thank you,  Andrey Campanile

## 2023-05-08 ENCOUNTER — Other Ambulatory Visit: Payer: Self-pay | Admitting: Nurse Practitioner

## 2023-05-08 DIAGNOSIS — E66813 Obesity, class 3: Secondary | ICD-10-CM

## 2023-05-12 ENCOUNTER — Encounter: Payer: Self-pay | Admitting: Nurse Practitioner

## 2023-05-12 ENCOUNTER — Ambulatory Visit (INDEPENDENT_AMBULATORY_CARE_PROVIDER_SITE_OTHER): Payer: Commercial Managed Care - PPO | Admitting: Nurse Practitioner

## 2023-05-12 VITALS — BP 110/70 | HR 85 | Temp 98.5°F | Ht 71.0 in | Wt 349.4 lb

## 2023-05-12 DIAGNOSIS — Z6841 Body Mass Index (BMI) 40.0 and over, adult: Secondary | ICD-10-CM

## 2023-05-12 DIAGNOSIS — E66813 Obesity, class 3: Secondary | ICD-10-CM | POA: Diagnosis not present

## 2023-05-12 DIAGNOSIS — Z2821 Immunization not carried out because of patient refusal: Secondary | ICD-10-CM | POA: Diagnosis not present

## 2023-05-12 MED ORDER — PHENTERMINE HCL 37.5 MG PO CAPS: 37.5000 mg | ORAL_CAPSULE | ORAL | 1 refills | Status: DC

## 2023-05-12 NOTE — Progress Notes (Signed)
 [place], CMA,acting as a Neurosurgeon for Bethany Epley, FNP.,have documented all relevant documentation on the behalf of Bethany Epley, FNP,as directed by  Bethany Epley, FNP while in the presence of Bethany Epley, FNP.  Subjective:  Patient ID: Bethany Conley , female    DOB: August 22, 1970 , 53 y.o.   MRN: [id number]  Chief Complaint  Patient presents with   Obesity    HPI  Patient presents today for a weight follow up, Patient reports compliance with medication. Patient denies any chest pain, SOB, or headaches. Patient has no concerns today. She is tolerating the phentermine 15 mg well without side effects. She has started going to the gym. She wants to feel better with her knees. She is taking 4 aleve. She is drinking three 16 oz water a day, occasionally will have peppermint tea. She is trying to eat healthier with more vegetables.   Her scale at home was 342 lbs.        Past Medical History:  Diagnosis Date   Abnormal uterine bleeding (AUB)    Anemia    Arthritis    knees   Essential hypertension    Morbid obesity (HCC)    Uterine leiomyoma      Family History  Problem Relation Age of Onset   Cancer Mother    Diabetes Father    Breast cancer Neg Hx      Current Outpatient Medications:    diclofenac Sodium (VOLTAREN) 1 % GEL, Apply 2 g topically 4 (four) times daily., Disp: 100 g, Rfl: 2   fluticasone (FLONASE) 50 MCG/ACT nasal spray, Place 2 sprays into both nostrils daily., Disp: 16 g, Rfl: 2   hydrochlorothiazide (HYDRODIURIL) 12.5 MG tablet, Take 1 tablet (12.5 mg total) by mouth daily., Disp: 90 tablet, Rfl: 1   Multiple Vitamins-Minerals (MULTIVIT/MULTIMINERAL ADULT) LIQD, Take by mouth daily., Disp: , Rfl:    traMADol (ULTRAM) 50 MG tablet, Take 50 mg by mouth 3 (three) times daily as needed., Disp: , Rfl:    phentermine 37.5 MG capsule, Take 1 capsule (37.5 mg total) by mouth every morning., Disp: 30 capsule, Rfl: 1   Allergies  Allergen Reactions   Sulfa  Antibiotics Rash     Review of Systems  Constitutional: Negative.   HENT: Negative.    Respiratory:  Negative for apnea and shortness of breath.   Cardiovascular: Negative.   Gastrointestinal: Negative.   Genitourinary: Negative.   Psychiatric/Behavioral: Negative.       Today's Vitals   05/12/23 1014  BP: 110/70  Pulse: 85  Temp: 98.5 F (36.9 C)  TempSrc: Oral  Weight: (!) 349 lb 6.4 oz (158.5 kg)  Height: 5\' 11"  (1.803 m)  PainSc: 0-No pain   Body mass index is 48.73 kg/m.  Wt Readings from Last 3 Encounters:  05/12/23 (!) 349 lb 6.4 oz (158.5 kg)  03/10/23 (!) 359 lb 6.4 oz (163 kg)  12/23/22 (!) 361 lb (163.7 kg)      Objective:  Physical Exam Vitals and nursing note reviewed.  Constitutional:      General: She is not in acute distress.    Appearance: Normal appearance. She is well-developed. She is obese.  HENT:     Head: Normocephalic and atraumatic.  Eyes:     Pupils: Pupils are equal, round, and reactive to light.  Cardiovascular:     Rate and Rhythm: Normal rate and regular rhythm.     Pulses: Normal pulses.     Heart sounds: Normal heart  sounds. No murmur heard. Pulmonary:     Effort: Pulmonary effort is normal. No respiratory distress.     Breath sounds: Normal breath sounds. No wheezing.  Skin:    General: Skin is warm and dry.     Capillary Refill: Capillary refill takes less than 2 seconds.  Neurological:     General: No focal deficit present.     Mental Status: She is alert and oriented to person, place, and time.     Cranial Nerves: No cranial nerve deficit.     Motor: No weakness.  Psychiatric:        Mood and Affect: Mood normal.        Behavior: Behavior normal.        Thought Content: Thought content normal.        Judgment: Judgment normal.      Assessment And Plan:  Class 3 severe obesity due to excess calories with body mass index (BMI) of 50.0 to 59.9 in adult, unspecified whether serious comorbidity present [facility]) Assessment  & Plan: Congratulated on her weight loss. She is encouraged to strive for BMI less than 30 to decrease cardiac risk. Advised to aim for at least 150 minutes of exercise per week. Will increase her phentermine to 37.5 mg daily.   Orders: -     Phentermine HCl; Take 1 capsule (37.5 mg total) by mouth every morning.  Dispense: 30 capsule; Refill: 1  COVID-19 vaccination declined Assessment & Plan: Declines covid 19 vaccine. Discussed risk of covid 34 and if she changes her mind about the vaccine to call the office. Education has been provided regarding the importance of this vaccine but patient still declined. Advised may receive this vaccine at local pharmacy or Health Dept.or vaccine clinic. Aware to provide a copy of the vaccination record if obtained from local pharmacy or Health Dept.  Encouraged to take multivitamin, vitamin d, vitamin c and zinc to increase immune system. Aware can call office if would like to have vaccine here at office. Verbalized acceptance and understanding.     Return for 2 months weight check.  Patient was given opportunity to ask questions. Patient verbalized understanding of the plan and was able to repeat key elements of the plan. All questions were answered to their satisfaction.    Inge Mangle, FNP, have reviewed all documentation for this visit. The documentation on 05/12/23 for the exam, diagnosis, procedures, and orders are all accurate and complete.   IF YOU HAVE BEEN REFERRED TO A SPECIALIST, IT MAY TAKE 1-2 WEEKS TO SCHEDULE/PROCESS THE REFERRAL. IF YOU HAVE NOT HEARD FROM US /SPECIALIST IN TWO WEEKS, PLEASE GIVE US  A CALL AT [phone].

## 2023-05-20 NOTE — Assessment & Plan Note (Signed)
 Congratulated on her weight loss. She is encouraged to strive for BMI less than 30 to decrease cardiac risk. Advised to aim for at least 150 minutes of exercise per week. Will increase her phentermine to 37.5 mg daily.

## 2023-05-20 NOTE — Assessment & Plan Note (Signed)

## 2023-06-05 ENCOUNTER — Other Ambulatory Visit: Payer: Self-pay | Admitting: Nurse Practitioner

## 2023-06-05 DIAGNOSIS — R0981 Nasal congestion: Secondary | ICD-10-CM

## 2023-07-21 ENCOUNTER — Encounter: Payer: Self-pay | Admitting: Nurse Practitioner

## 2023-07-21 ENCOUNTER — Other Ambulatory Visit: Payer: Self-pay | Admitting: Nurse Practitioner

## 2023-07-21 ENCOUNTER — Telehealth: Payer: Self-pay | Admitting: Nurse Practitioner

## 2023-07-21 ENCOUNTER — Ambulatory Visit: Admitting: Nurse Practitioner

## 2023-07-21 VITALS — BP 120/80 | HR 84 | Temp 99.3°F | Ht 71.0 in | Wt 341.6 lb

## 2023-07-21 DIAGNOSIS — Z6841 Body Mass Index (BMI) 40.0 and over, adult: Secondary | ICD-10-CM

## 2023-07-21 DIAGNOSIS — E66813 Obesity, class 3: Secondary | ICD-10-CM

## 2023-07-21 DIAGNOSIS — Z2821 Immunization not carried out because of patient refusal: Secondary | ICD-10-CM | POA: Diagnosis not present

## 2023-07-21 NOTE — Telephone Encounter (Signed)
 Copied from CRM [id number]. Topic: Clinical - Medication Refill >> Jul 21, 2023  2:46 PM Lynnie Saucier S wrote: Medication: phentermine  37.5 MG capsule   Has the patient contacted their pharmacy? Yes (Agent: If no, request that the patient contact the pharmacy for the refill. If patient does not wish to contact the pharmacy document the reason why and proceed with request.) (Agent: If yes, when and what did the pharmacy advise?)Call provider  This is the patient's preferred pharmacy:  [facility] [phone] - [place], [place] - [address] [place] [address] Phone: [phone] Fax: [phone]  Is this the correct pharmacy for this prescription? Yes If no, delete pharmacy and type the correct one.   Has the prescription been filled recently? No  Is the patient out of the medication? Yes  Has the patient been seen for an appointment in the last year OR does the patient have an upcoming appointment? Yes  Can we respond through MyChart? Yes  Agent: Please be advised that Rx refills may take up to 3 business days. We ask that you follow-up with your pharmacy.

## 2023-07-21 NOTE — Progress Notes (Signed)
 LILLETTE Kristeen JINNY Gladis, CMA,acting as a Neurosurgeon for [name], FNP.,have documented all relevant documentation on the behalf of [name], FNP,as directed by  [name], FNP while in the presence of [name], FNP.  Subjective:  Patient ID: Bethany Conley , female    DOB: 1970/12/18 , 53 y.o.   MRN: [id number]  Chief Complaint  Patient presents with   Obesity    Patient presents today for a weight follow up, Patient reports compliance with medication. Patient denies any chest pain, SOB, or headaches. Patient reports she hasn't lose much weight since the increase. Patient reports she isnt working out.     HPI  Here for obesity management. She is currently on phentermine  37.5 mg daily.    Starting weight: 359 lbs Starting date: 03/10/2023 Today's weight: 341 lbs Today's date: 07/21/2023  Total lbs lost to date: 18 lbs Total lbs lost since last in-office visit: 8 lbs Wt Readings from Last 3 Encounters: 07/21/23 : (!) 341 lb 9.6 oz (154.9 kg) 05/12/23 : (!) 349 lb 6.4 oz (158.5 kg) 03/10/23 : (!) 359 lb 6.4 oz (163 kg)    Exercising: some Water intake: adequate Diet: Healthy, protein shake for breakfast. Lunch will do salad and evening not hungry will have cereal (grain) for dinner.        Past Medical History:  Diagnosis Date   Abnormal uterine bleeding (AUB)    Anemia    Arthritis    knees   Essential hypertension    Morbid obesity (HCC)    Uterine leiomyoma      Family History  Problem Relation Age of Onset   Cancer Mother    Diabetes Father    Breast cancer Neg Hx      Current Outpatient Medications:    diclofenac  Sodium (VOLTAREN ) 1 % GEL, Apply 2 g topically 4 (four) times daily., Disp: 100 g, Rfl: 2   fluticasone  (FLONASE ) 50 MCG/ACT nasal spray, SPRAY 2 SPRAYS INTO EACH NOSTRIL EVERY DAY, Disp: 48 mL, Rfl: 0   hydrochlorothiazide  (HYDRODIURIL ) 12.5 MG tablet, Take 1 tablet (12.5 mg total) by mouth daily., Disp: 90 tablet, Rfl: 1   Multiple  Vitamins-Minerals (MULTIVIT/MULTIMINERAL ADULT) LIQD, Take by mouth daily., Disp: , Rfl:    phentermine  37.5 MG capsule, Take 1 capsule (37.5 mg total) by mouth every morning., Disp: 30 capsule, Rfl: 1   traMADol (ULTRAM) 50 MG tablet, Take 50 mg by mouth 3 (three) times daily as needed., Disp: , Rfl:    Allergies  Allergen Reactions   Sulfa Antibiotics Rash     Review of Systems  Constitutional: Negative.   HENT: Negative.    Respiratory:  Negative for apnea and shortness of breath.   Cardiovascular: Negative.   Gastrointestinal: Negative.   Genitourinary: Negative.   Psychiatric/Behavioral: Negative.       Today's Vitals   07/21/23 1153  BP: 120/80  Pulse: 84  Temp: 99.3 F (37.4 C)  TempSrc: Oral  Weight: (!) 341 lb 9.6 oz (154.9 kg)  Height: 5' 11 (1.803 m)  PainSc: 0-No pain   Body mass index is 47.64 kg/m.  Wt Readings from Last 3 Encounters:  07/21/23 (!) 341 lb 9.6 oz (154.9 kg)  05/12/23 (!) 349 lb 6.4 oz (158.5 kg)  03/10/23 (!) 359 lb 6.4 oz (163 kg)     Objective:  Physical Exam Vitals and nursing note reviewed.  Constitutional:      General: She is not in acute distress.    Appearance: Normal  appearance. She is well-developed. She is obese.  HENT:     Head: Normocephalic and atraumatic.   Eyes:     Pupils: Pupils are equal, round, and reactive to light.    Cardiovascular:     Rate and Rhythm: Normal rate and regular rhythm.     Pulses: Normal pulses.     Heart sounds: Normal heart sounds. No murmur heard. Pulmonary:     Effort: Pulmonary effort is normal. No respiratory distress.     Breath sounds: Normal breath sounds. No wheezing.   Skin:    General: Skin is warm and dry.     Capillary Refill: Capillary refill takes less than 2 seconds.   Neurological:     General: No focal deficit present.     Mental Status: She is alert and oriented to person, place, and time.     Cranial Nerves: No cranial nerve deficit.     Motor: No weakness.    Psychiatric:        Mood and Affect: Mood normal.        Behavior: Behavior normal.        Thought Content: Thought content normal.        Judgment: Judgment normal.         Assessment And Plan:  Morbid obesity with BMI of 45.0-49.9, adult [facility]) Assessment & Plan: Weight has decreased by 8 lbs, she is doing well on phentermine . Continue current dose. Continue focusing on healthy diet and regular exercise.    COVID-19 vaccination declined Assessment & Plan: Declines covid 19 vaccine. Discussed risk of covid 26 and if she changes her mind about the vaccine to call the office. Education has been provided regarding the importance of this vaccine but patient still declined. Advised may receive this vaccine at local pharmacy or Health Dept.or vaccine clinic. Aware to provide a copy of the vaccination record if obtained from local pharmacy or Health Dept.  Encouraged to take multivitamin, vitamin d, vitamin c and zinc to increase immune system. Aware can call office if would like to have vaccine here at office. Verbalized acceptance and understanding.    Herpes zoster vaccination declined Assessment & Plan: Declines shingrix , educated on disease process and is aware if he changes his mind to notify office      Return for keep same next.  Patient was given opportunity to ask questions. Patient verbalized understanding of the plan and was able to repeat key elements of the plan. All questions were answered to their satisfaction.    LILLETTE [name], FNP, have reviewed all documentation for this visit. The documentation on 07/21/2023 for the exam, diagnosis, procedures, and orders are all accurate and complete.   IF YOU HAVE BEEN REFERRED TO A SPECIALIST, IT MAY TAKE 1-2 WEEKS TO SCHEDULE/PROCESS THE REFERRAL. IF YOU HAVE NOT HEARD FROM US /SPECIALIST IN TWO WEEKS, PLEASE GIVE US  A CALL AT [phone].

## 2023-07-27 NOTE — Assessment & Plan Note (Signed)
 Declines shingrix, educated on disease process and is aware if he changes his mind to notify office

## 2023-07-27 NOTE — Assessment & Plan Note (Signed)

## 2023-07-27 NOTE — Assessment & Plan Note (Signed)
 Weight has decreased by 8 lbs, she is doing well on phentermine . Continue current dose. Continue focusing on healthy diet and regular exercise.

## 2023-07-28 ENCOUNTER — Other Ambulatory Visit: Payer: Self-pay | Admitting: Nurse Practitioner

## 2023-07-28 ENCOUNTER — Encounter: Payer: Self-pay | Admitting: Nurse Practitioner

## 2023-07-28 DIAGNOSIS — E66813 Obesity, class 3: Secondary | ICD-10-CM

## 2023-07-28 MED ORDER — PHENTERMINE HCL 37.5 MG PO CAPS: 37.5000 mg | ORAL_CAPSULE | ORAL | 1 refills | Status: DC

## 2023-08-05 ENCOUNTER — Other Ambulatory Visit: Payer: Self-pay | Admitting: Nurse Practitioner

## 2023-08-05 DIAGNOSIS — Z1231 Encounter for screening mammogram for malignant neoplasm of breast: Secondary | ICD-10-CM

## 2023-08-12 ENCOUNTER — Ambulatory Visit

## 2023-08-12 ENCOUNTER — Ambulatory Visit
Admission: RE | Admit: 2023-08-12 | Discharge: 2023-08-12 | Disposition: A | Discharge status: Home / Self Care | Source: Ambulatory Visit | Attending: Nurse Practitioner | Admitting: Nurse Practitioner

## 2023-08-12 DIAGNOSIS — Z1231 Encounter for screening mammogram for malignant neoplasm of breast: Secondary | ICD-10-CM

## 2023-09-08 ENCOUNTER — Ambulatory Visit: Payer: Self-pay | Admitting: Nurse Practitioner

## 2023-09-08 NOTE — Progress Notes (Deleted)
 Bethany Conley, CMA,acting as a Neurosurgeon for Bethany [name], FNP.,have documented all relevant documentation on the behalf of Bethany [name], FNP,as directed by  Bethany [name], FNP while in the presence of Bethany [name], FNP.  Subjective:  Patient ID: Bethany Conley , female    DOB: 1970-06-11 , 53 y.o.   MRN: [id number]  No chief complaint on file.   HPI  HPI   Past Medical History:  Diagnosis Date   Abnormal uterine bleeding (AUB)    Anemia    Arthritis    knees   Essential hypertension    Morbid obesity (HCC)    Uterine leiomyoma      Family History  Problem Relation Age of Onset   Cancer Mother    Diabetes Father    Breast cancer Neg Hx    BRCA 1/2 Neg Hx      Current Outpatient Medications:    diclofenac  Sodium (VOLTAREN ) 1 % GEL, Apply 2 g topically 4 (four) times daily., Disp: 100 g, Rfl: 2   fluticasone  (FLONASE ) 50 MCG/ACT nasal spray, SPRAY 2 SPRAYS INTO EACH NOSTRIL EVERY DAY, Disp: 48 mL, Rfl: 0   hydrochlorothiazide  (HYDRODIURIL ) 12.5 MG tablet, Take 1 tablet (12.5 mg total) by mouth daily., Disp: 90 tablet, Rfl: 1   Multiple Vitamins-Minerals (MULTIVIT/MULTIMINERAL ADULT) LIQD, Take by mouth daily., Disp: , Rfl:    phentermine  37.5 MG capsule, Take 1 capsule (37.5 mg total) by mouth every morning., Disp: 30 capsule, Rfl: 1   traMADol (ULTRAM) 50 MG tablet, Take 50 mg by mouth 3 (three) times daily as needed., Disp: , Rfl:    Allergies  Allergen Reactions   Sulfa Antibiotics Rash     Review of Systems   There were no vitals filed for this visit. There is no height or weight on file to calculate BMI.  Wt Readings from Last 3 Encounters:  07/21/23 (!) 341 lb 9.6 oz (154.9 kg)  05/12/23 (!) 349 lb 6.4 oz (158.5 kg)  03/10/23 (!) 359 lb 6.4 oz (163 kg)    The 10-year ASCVD risk score (Arnett DK, et al., 2019) is: 3.1%   Values used to calculate the score:     Age: 53 years     Clincally relevant sex: Female     Is Non-Hispanic African American: Yes      Diabetic: No     Tobacco smoker: No     Systolic Blood Pressure: 120 mmHg     Is BP treated: Yes     HDL Cholesterol: 67 mg/dL     Total Cholesterol: 208 mg/dL  Objective:  Physical Exam      Assessment And Plan:  There are no diagnoses linked to this encounter.  No follow-ups on file.  Patient was given opportunity to ask questions. Patient verbalized understanding of the plan and was able to repeat key elements of the plan. All questions were answered to their satisfaction.    Bethany Bethany [name], FNP, have reviewed all documentation for this visit. The documentation on 09/08/23 for the exam, diagnosis, procedures, and orders are all accurate and complete.   IF YOU HAVE BEEN REFERRED TO A SPECIALIST, IT MAY TAKE 1-2 WEEKS TO SCHEDULE/PROCESS THE REFERRAL. IF YOU HAVE NOT HEARD FROM US /SPECIALIST IN TWO WEEKS, PLEASE GIVE US  A CALL AT [phone] X [phone].

## 2023-10-08 ENCOUNTER — Encounter: Payer: Self-pay | Admitting: Nurse Practitioner

## 2023-10-08 ENCOUNTER — Ambulatory Visit: Payer: Self-pay | Admitting: Nurse Practitioner

## 2023-10-08 VITALS — BP 122/80 | HR 85 | Temp 97.6°F | Ht 71.0 in | Wt 346.0 lb

## 2023-10-08 DIAGNOSIS — R232 Flushing: Secondary | ICD-10-CM

## 2023-10-08 DIAGNOSIS — M25561 Pain in right knee: Secondary | ICD-10-CM | POA: Diagnosis not present

## 2023-10-08 DIAGNOSIS — M25562 Pain in left knee: Secondary | ICD-10-CM

## 2023-10-08 DIAGNOSIS — G8929 Other chronic pain: Secondary | ICD-10-CM | POA: Diagnosis not present

## 2023-10-08 DIAGNOSIS — E66813 Obesity, class 3: Secondary | ICD-10-CM | POA: Diagnosis not present

## 2023-10-08 DIAGNOSIS — Z6841 Body Mass Index (BMI) 40.0 and over, adult: Secondary | ICD-10-CM

## 2023-10-08 MED ORDER — PAROXETINE HCL 10 MG PO TABS: 10.0000 mg | ORAL_TABLET | Freq: Every day | ORAL | 1 refills | Status: AC

## 2023-10-08 MED ORDER — WEGOVY 0.5 MG/0.5ML ~~LOC~~ SOAJ: 0.5000 mg | SUBCUTANEOUS | 0 refills | Status: DC

## 2023-10-08 NOTE — Progress Notes (Signed)
 I,Victoria T Emmitt, CMA,acting as a Neurosurgeon for [name], FNP.,have documented all relevant documentation on the behalf of [name], FNP,as directed by  [name], FNP while in the presence of [name], FNP.  Subjective:  Patient ID: Bethany Conley , female    DOB: [birth year] , 53 y.o.   MRN: [id number]  Chief Complaint  Patient presents with   Hot Flashes    Patient presents today for hot flashes. She reports they have been getting worse within the last couple of months. She has tried black cohash and something else she doesn't remember the name.    Weight Check    Patient would like to discuss her options for weight loss meds. She was previously on phentermine . She finished her last dose about 2 weeks ago.      HPI   Discussed the use of AI scribe software for clinical note transcription with the patient, who gave verbal consent to proceed.  History of Present Illness SHARRYN BELDING is a 53 year old female who presents with medication management and weight concerns.  She experiences significant menopause symptoms, including hot flashes and night sweats, describing them as her 'whole body feeling inflamed.' She has tried black cohosh and over-the-counter estrogen without relief and stopped taking black cohosh a week ago. She is frustrated with the lack of effectiveness of these treatments.  She has run out of her phentermine  medication for weight management due to a schedule change at work that led to the cancellation of her previous appointment. Her diet includes no bread or starches, and she consumes a protein shake every morning. She occasionally eats grilled chicken or salad for lunch. She drinks about three cups of coffee a day and is attempting to reduce her intake to one cup while increasing her water intake.  She describes persistent knee pain despite receiving gel injections. The pain is intermittent, with episodes of popping sounds in her knees. She mentions  that the gel injections initially provided relief but are no longer effective. She uses a natural supplement called 'feel good' and a cream at night to manage the pain, which helps her sleep but does not prevent waking up due to hot flashes. She has a history of knee issues and reports that her orthopedic doctor told her she is 'bone on bone.' She is seeking a second opinion as she does not experience severe daily pain and wants to explore other options.  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 However, she does not engage in regular exercise due to her knee pain and work schedule. She has considered water aerobics as a potential exercise option but has not yet pursued it. She lives in [place] and commutes to [place] for work.   Past Medical History:  Diagnosis Date   Abnormal uterine bleeding (AUB)    Anemia    Arthritis    knees   Essential hypertension    Morbid obesity (HCC)    Uterine leiomyoma      Family History  Problem Relation Age of Onset   Cancer Mother    Diabetes Father    Breast cancer Neg Hx    BRCA 1/2 Neg Hx      Current Outpatient Medications:    hydrochlorothiazide  (HYDRODIURIL ) 12.5 MG tablet, Take 1 tablet (12.5 mg total) by mouth daily., Disp: 90 tablet, Rfl: 1   Multiple Vitamins-Minerals (MULTIVIT/MULTIMINERAL ADULT) LIQD, Take by mouth  daily., Disp: , Rfl:    PARoxetine  (PAXIL ) 10 MG tablet, Take 1 tablet (10 mg total) by mouth daily., Disp: 90 tablet, Rfl: 1   semaglutide -weight management (WEGOVY ) 0.5 MG/0.5ML SOAJ SQ injection, Inject 0.5 mg into the skin once a week., Disp: 2 mL, Rfl: 0   diclofenac  Sodium (VOLTAREN ) 1 % GEL, Apply 2 g topically 4 (four) times daily. (Patient not taking: Reported on 10/08/2023), Disp: 100 g, Rfl: 2   fluticasone  (FLONASE ) 50 MCG/ACT nasal spray, SPRAY 2 SPRAYS INTO EACH NOSTRIL EVERY DAY (Patient not taking: Reported on 10/08/2023), Disp: 48 mL,  Rfl: 0   phentermine  37.5 MG capsule, Take 1 capsule (37.5 mg total) by mouth every morning. (Patient not taking: Reported on 10/08/2023), Disp: 30 capsule, Rfl: 1   traMADol (ULTRAM) 50 MG tablet, Take 50 mg by mouth 3 (three) times daily as needed. (Patient not taking: Reported on 10/08/2023), Disp: , Rfl:    Allergies  Allergen Reactions   Sulfa Antibiotics Rash     Review of Systems  Constitutional: Negative.   Respiratory: Negative.    Cardiovascular: Negative.   Neurological: Negative.   Psychiatric/Behavioral: Negative.       Today's Vitals   10/08/23 0849  BP: 122/80  Pulse: 85  Temp: 97.6 F (36.4 C)  TempSrc: Oral  Weight: (!) 346 lb (156.9 kg)  Height: 5' 11 (1.803 m)  PainSc: 8   PainLoc: Knee   Body mass index is 48.26 kg/m.  Wt Readings from Last 3 Encounters:  10/08/23 (!) 346 lb (156.9 kg)  07/21/23 (!) 341 lb 9.6 oz (154.9 kg)  05/12/23 (!) 349 lb 6.4 oz (158.5 kg)     Objective:  Physical Exam Vitals and nursing note reviewed.  Constitutional:      General: She is not in acute distress.    Appearance: Normal appearance. She is well-developed. She is obese.  HENT:     Head: Normocephalic and atraumatic.  Eyes:     Pupils: Pupils are equal, round, and reactive to light.  Cardiovascular:     Rate and Rhythm: Normal rate and regular rhythm.     Pulses: Normal pulses.     Heart sounds: Normal heart sounds. No murmur heard. Pulmonary:     Effort: Pulmonary effort is normal. No respiratory distress.     Breath sounds: Normal breath sounds. No wheezing.  Skin:    General: Skin is warm and dry.     Capillary Refill: Capillary refill takes less than 2 seconds.  Neurological:     General: No focal deficit present.     Mental Status: She is alert and oriented to person, place, and time.     Cranial Nerves: No cranial nerve deficit.     Motor: No weakness.  Psychiatric:        Mood and Affect: Mood normal.        Behavior: Behavior normal.         Thought Content: Thought content normal.        Judgment: Judgment normal.      Assessment And Plan:  Hot flashes Assessment & Plan: Severe menopausal symptoms affecting daily life. Previous treatments ineffective. Discussed SSRIs like Paxil  and hormonal treatments like estradiol. Insurance may require Paxil  trial before newer options like Veozah. - Prescribe Paxil  10 mg once daily for menopausal symptoms. - Discuss potential side effects of Paxil , including dry mouth and possible weight gain. - Schedule follow-up in 6 weeks to assess response to Paxil . - Advise  reducing caffeine intake to help manage hot flashes.  Orders: -     PARoxetine  HCl; Take 1 tablet (10 mg total) by mouth daily.  Dispense: 90 tablet; Refill: 1  Chronic pain of both knees Assessment & Plan: Chronic knee pain with limited relief from current treatments. Previous evaluation suggested knee replacement. Awaiting second opinion. - Encourage weight loss to potentially improve knee pain and facilitate future knee replacement. - Explore water aerobics as a low-impact exercise option to reduce knee strain. - Await second opinion from orthopedic specialist regarding knee replacement.   Class 3 severe obesity due to excess calories without serious comorbidity with body mass index (BMI) of 45.0 to 49.9 in adult Assessment & Plan: Morbid obesity with recent weight gain, possibly due to phentermine  discontinuation. Weight management hindered by knee pain. Discussed weight loss medications, including Wegovy  and Qsymia. - Submit prescription for Wegovy  and check insurance approval. - Provide a sample of Wegovy  and instruct not to start until insurance approval is confirmed. - Discuss Qsymia as an alternative if Wegovy  is not approved. - Encourage exploration of water aerobics to increase physical activity without exacerbating knee pain.  Orders: -     Wegovy ; Inject 0.5 mg into the skin once a week.  Dispense: 2 mL; Refill:  0     Return for 6 weeks medication f/u.  Patient was given opportunity to ask questions. Patient verbalized understanding of the plan and was able to repeat key elements of the plan. All questions were answered to their satisfaction.  [name], FNP  I, [name], FNP, have reviewed all documentation for this visit. The documentation on 10/08/23 for the exam, diagnosis, procedures, and orders are all accurate and complete.   IF YOU HAVE BEEN REFERRED TO A SPECIALIST, IT MAY TAKE 1-2 WEEKS TO SCHEDULE/PROCESS THE REFERRAL. IF YOU HAVE NOT HEARD FROM US /SPECIALIST IN TWO WEEKS, PLEASE GIVE US  A CALL AT [phone].   THE PATIENT IS ENCOURAGED TO PRACTICE SOCIAL DISTANCING DUE TO THE COVID-19 PANDEMIC.

## 2023-10-08 NOTE — Patient Instructions (Signed)

## 2023-10-19 NOTE — Assessment & Plan Note (Signed)
 Severe menopausal symptoms affecting daily life. Previous treatments ineffective. Discussed SSRIs like Paxil  and hormonal treatments like estradiol. Insurance may require Paxil  trial before newer options like Veozah. - Prescribe Paxil  10 mg once daily for menopausal symptoms. - Discuss potential side effects of Paxil , including dry mouth and possible weight gain. - Schedule follow-up in 6 weeks to assess response to Paxil . - Advise reducing caffeine intake to help manage hot flashes.

## 2023-10-19 NOTE — Assessment & Plan Note (Signed)
 Chronic knee pain with limited relief from current treatments. Previous evaluation suggested knee replacement. Awaiting second opinion. - Encourage weight loss to potentially improve knee pain and facilitate future knee replacement. - Explore water aerobics as a low-impact exercise option to reduce knee strain. - Await second opinion from orthopedic specialist regarding knee replacement.

## 2023-10-19 NOTE — Assessment & Plan Note (Signed)
 Morbid obesity with recent weight gain, possibly due to phentermine  discontinuation. Weight management hindered by knee pain. Discussed weight loss medications, including Wegovy  and Qsymia. - Submit prescription for Wegovy  and check insurance approval. - Provide a sample of Wegovy  and instruct not to start until insurance approval is confirmed. - Discuss Qsymia as an alternative if Wegovy  is not approved. - Encourage exploration of water aerobics to increase physical activity without exacerbating knee pain.

## 2023-10-21 ENCOUNTER — Ambulatory Visit: Payer: Self-pay | Admitting: Nurse Practitioner

## 2023-10-21 ENCOUNTER — Other Ambulatory Visit: Payer: Self-pay | Admitting: Nurse Practitioner

## 2023-10-21 DIAGNOSIS — I10 Essential (primary) hypertension: Secondary | ICD-10-CM

## 2023-10-28 ENCOUNTER — Encounter: Payer: Self-pay | Admitting: Nurse Practitioner

## 2023-10-29 ENCOUNTER — Telehealth: Payer: Self-pay

## 2023-10-29 NOTE — Telephone Encounter (Signed)
Rx benefits verified YL,RMA

## 2023-11-02 ENCOUNTER — Encounter: Payer: Self-pay | Admitting: Nurse Practitioner

## 2023-11-03 ENCOUNTER — Telehealth: Payer: Self-pay | Admitting: Internal Medicine

## 2023-11-03 ENCOUNTER — Encounter: Payer: Self-pay | Admitting: Internal Medicine

## 2023-11-03 ENCOUNTER — Ambulatory Visit: Admitting: Internal Medicine

## 2023-11-03 ENCOUNTER — Ambulatory Visit: Payer: Self-pay

## 2023-11-03 VITALS — BP 110/80 | HR 124 | Ht 71.0 in | Wt 341.0 lb

## 2023-11-03 DIAGNOSIS — I10 Essential (primary) hypertension: Secondary | ICD-10-CM | POA: Diagnosis not present

## 2023-11-03 DIAGNOSIS — H8110 Benign paroxysmal vertigo, unspecified ear: Secondary | ICD-10-CM

## 2023-11-03 DIAGNOSIS — Z6841 Body Mass Index (BMI) 40.0 and over, adult: Secondary | ICD-10-CM | POA: Diagnosis not present

## 2023-11-03 MED ORDER — ALPRAZOLAM 0.25 MG PO TABS: ORAL_TABLET | ORAL | 0 refills | Status: AC

## 2023-11-03 NOTE — Telephone Encounter (Signed)
 This encounter was created in error - please disregard.   Opened in error, See previous triage encounter today.

## 2023-11-03 NOTE — Telephone Encounter (Signed)
 FYI Only or Action Required?: FYI only for provider.  Patient was last seen in primary care on 10/08/2023 by Georgina Speaks, FNP.  Called Nurse Triage reporting Dizziness.  Symptoms began several days ago.  Interventions attempted: Rest, hydration, or home remedies.  Symptoms are: unchanged.  Triage Disposition: See Physician Within 24 Hours  Patient/caregiver understands and will follow disposition?: Yes Reason for Disposition  [1] MODERATE dizziness (e.g., vertigo; feels very unsteady, interferes with normal activities) AND [2] has NOT been evaluated by doctor (or NP/PA) for this  Answer Assessment - Initial Assessment Questions States feels like previous episode of vertigo. Denies nausea, vomiting, headache, vision changes, weakness or numbness.   1. DESCRIPTION: Describe your dizziness.     Feels like room is spinning with positional changes or when walking.   2. VERTIGO: Do you feel like either you or the room is spinning or tilting?      Yes, spinning  3. LIGHTHEADED: Do you feel lightheaded? (e.g., somewhat faint, woozy, weak upon standing)     Feels like spinning sensation  4. SEVERITY: How bad is it?  Can you walk?     States is able to ambulate unassisted  5. ONSET:  When did the dizziness begin?     11/01/23  Protocols used: Dizziness - Vertigo-A-AH Copied from CRM [id number]. Topic: Clinical - Red Word Triage >> Nov 03, 2023 10:18 AM Carlyon D wrote: Red Word that prompted transfer to Nurse Triage: Pt is very dizzy and light headed. When pt stands up states everything is spinning.

## 2023-11-03 NOTE — Progress Notes (Signed)
 Patient Care Team: Georgina Speaks, FNP as PCP - General (General Practice)  Visit Date: 11/03/23  Subjective:    Patient ID: Bethany Conley , Female   DOB: April 18, 1970, 53 y.o.    MRN: [id number]   53 y.o. Female presents today for dizziness. Patient has a past medical history of Morbid obesity, Iron deficiency anemia, and hypertension.  Patient is followed by Rand Georgina, FNP at [facility].  Patient is employed by [employer].  Works with Corporate treasurer at [employer].  She is a native of [place] .  She says that on Saturday when she woke up everything was spinning. She said this has happened before when she was in her 51s while residing in [place] .  She recently flew to [place]  and denies being sick recently. She said she went to get her hair shampooed and when she tilted her head back into the washbowl she became dizzy. Today she says that when she was lying down  getting her blood pressure checked here in the office she felt dizzy.   PMH:  Was treated for vertigo at urgent care on [address] here in [place] on 05/25/15. Had Head CT which was normal.  History of mild hypertension treated with Hydrodiuril  12.5 mg daily.   History of Iron deficiency anemia.  Laparoscopic hysterectomy with BSO done April 2024.  History of Obesity treated with Wegovy  0.5 mg injected into the skin once a week until recently when it became unavailable to her on her insurance plan.  Her primary care provider also has prescribed for her phentermine  37.5 mg daily to help with weight loss   Past Medical History:  Diagnosis Date   Abnormal uterine bleeding (AUB)    Anemia    Arthritis    knees   Essential hypertension    Morbid obesity (HCC)    Uterine leiomyoma      Family History  Problem Relation Age of Onset   Cancer Mother    Diabetes Father    Breast cancer Neg Hx    BRCA 1/2 Neg Hx     Social Hx: reviewed. Has 3 sons. Never smoked. Has been to  healthy weight clinic in the past.     Review of Systems  Constitutional:  Negative for chills and fever.  HENT:  Negative for sore throat.   Neurological:  Positive for dizziness.        Objective:   Vitals: BP 110/80 (BP Location: Left Arm, Patient Position: Standing)   Pulse (!) 124   Ht 5' 11 (1.803 m)   Wt (!) 341 lb (154.7 kg)   LMP  (LMP Unknown)   SpO2 98%   BMI 47.56 kg/m    Physical Exam HENT:     Right Ear: Hearing, tympanic membrane, ear canal and external ear normal.     Left Ear: Hearing, tympanic membrane, ear canal and external ear normal.     Mouth/Throat:     Pharynx: Oropharynx is clear.  Eyes:     Extraocular Movements: Extraocular movements intact.     Right eye: Nystagmus present.     Left eye: Nystagmus present.     Comments: Rightward nystagmus  Neck:     Thyroid: No thyroid mass, thyromegaly or thyroid tenderness.     Vascular: No carotid bruit.  Cardiovascular:     Rate and Rhythm: Normal rate and regular rhythm.     Heart sounds: Normal heart sounds, S1 normal and S2 normal.  No murmur heard. Pulmonary:     Breath sounds: Normal breath sounds.  Neurological:     Deep Tendon Reflexes: Reflexes are normal and symmetric.       Results:   Studies obtained and personally reviewed by me:     Labs:       Component Value Date/Time   NA 137 03/10/2023 1123   K 4.6 03/10/2023 1123   CL 100 03/10/2023 1123   CO2 25 03/10/2023 1123   GLUCOSE 88 03/10/2023 1123   GLUCOSE 154 (H) 05/07/2022 1503   BUN 14 03/10/2023 1123   CREATININE 0.76 03/10/2023 1123   CREATININE 0.59 05/25/2015 1424   CALCIUM 9.3 03/10/2023 1123   PROT 7.7 03/10/2023 1123   ALBUMIN 4.1 03/10/2023 1123   AST 17 03/10/2023 1123   ALT 13 03/10/2023 1123   ALKPHOS 124 (H) 03/10/2023 1123   BILITOT 0.3 03/10/2023 1123   GFRNONAA >60 05/07/2022 1503   GFRNONAA >89 05/25/2015 1424   GFRAA >60 02/26/2019 2115   GFRAA >89 05/25/2015 1424     Lab Results   Component Value Date   WBC 9.6 03/10/2023   HGB 11.8 03/10/2023   HCT 35.7 03/10/2023   MCV 92 03/10/2023   PLT 368 03/10/2023    Lab Results  Component Value Date   CHOL 208 (H) 03/10/2023   HDL 67 03/10/2023   LDLCALC 125 (H) 03/10/2023   TRIG 90 03/10/2023   CHOLHDL 3.1 03/10/2023    Lab Results  Component Value Date   HGBA1C 5.2 11/11/2022     Lab Results  Component Value Date   TSH 1.100 03/10/2023          Assessment & Plan:   Benign Positional Vertigo: She says that on Saturday when she woke up everything was spinning. She said this has happened before when she was in her 57s and on 2017. She recently traveled to [place]  and denies being sick recently. She said she went to get her hair shampooed and when she tilted her head back into the washbowl she became dizzy. Today, when she was laying down and getting her blood pressure checked she became dizzy. She works at [employer]. She says she's somewhat stressed as they are short staffed.  No recent URI, headache, fever or chills. No scotomata.  Xanax 0.25 mg three times daily as needed  was prescribed.  Rest and stay well hydrated. Call if not improving in 48 hours or sooner if worse.  Hypertension: treated with Hydrodiuril  12.5 mg daily. BP stable.   Hx Iron deficiency anemia:  resolved after hysterectomy, Hgb was 11.8 grams Feb 2025. MCV has been normal.  Obesity: treated with Wegovy  0.5 mg injected into the skin once a week and Phentermine  37.5 mg daily. Reported not taking as it was denied by her insurance.   Told to discontinue phentermine  for a few days until vertigo resolves  Will send [facility] a message to see if they can assist her in getting Wegovy  approved.    I,Makayla C Reid,acting as a scribe for Ronal JINNY Hailstone, MD.,have documented all relevant documentation on the behalf of Ronal JINNY Hailstone, MD,as directed by  Ronal JINNY Hailstone, MD while in the presence of Ronal JINNY Hailstone,  MD.

## 2023-11-03 NOTE — Patient Instructions (Signed)
 Please take Xanax 0.25 mg up to 3 times daily as needed for vertigo. Can if not better in 48 hours or sooner if worse. We will see if [facility] can assist in getting Wegovy  approved for you. Note given to be out of work tomorrow. Rest and stay well hydrated.

## 2023-11-04 ENCOUNTER — Encounter: Payer: Self-pay | Admitting: Internal Medicine

## 2023-11-05 ENCOUNTER — Telehealth: Payer: Self-pay

## 2023-11-05 NOTE — Progress Notes (Signed)
 Care Guide Pharmacy Note  11/05/2023 Name: [name] MRN: [id number] DOB: Oct 11, 1970  Referred By: Georgina Speaks, FNP Reason for referral: Complex Care Management and Call Attempt #1 (Successful initial outreach scheduled with PHARM D- Channing)   [name] is a 54 y.o. year old female who is a primary care patient of Georgina Speaks, FNP.  Amado KANDICE Getman was referred to the pharmacist for assistance related to: BMI  Successful contact was made with the patient to discuss pharmacy services including being ready for the pharmacist to call at least 5 minutes before the scheduled appointment time and to have medication bottles and any blood pressure readings ready for review. The patient agreed to meet with the pharmacist via telephone visit on (date/time). 11/10/23 @ 8 AM.   Leotis Rase [facility], [facility] Guide  Direct Dial: [phone]  Fax [phone]

## 2023-11-10 ENCOUNTER — Other Ambulatory Visit: Payer: Self-pay

## 2023-11-10 NOTE — Progress Notes (Signed)
   11/10/2023  Patient ID: Bethany Conley, female   DOB: [birth year], 53 y.o.   MRN: [id number]  PA for Wegovy  0.5mg  weekly has been approved by insurance.  [facility]ed [facility], and Wegovy  0.5mg  is now going through for $24.99.  Prescription will be ready later today, and I have contacted the patient to make her aware.  [name], PharmD, DPLA

## 2023-11-10 NOTE — Progress Notes (Signed)
 11/10/2023 Name: Bethany Conley MRN: [id number] DOB: 1970-02-17  Chief Complaint  Patient presents with   Medication Assistance   Bethany Conley is a 53 y.o. year old female who presented for a telephone visit.   They were referred to the pharmacist by their PCP for assistance in managing weight management.   Subjective:  Care Team: Primary Care Provider: Georgina Speaks, FNP ; Next Scheduled Visit: 11/17/2023  Medication Access/Adherence  [facility] Pharmacy[facility]  [phone] [place] [place] - [address] [place] [address] Phone: [phone] Fax: [phone]  [id number] [place] [place] - [address] [place] [address] Phone: [phone] Fax: [phone]  -Patient reports affordability concerns with their medications: Yes  -Patient reports access/transportation concerns to their pharmacy: No  -Patient reports adherence concerns with their medications:  Yes    Obesity/Overweight: Current medications: none -Weight Management treatments previously prescribed: patient was taking phentermine  37.5mg  daily, but this was stopped when a sample of Wegovy  0.25mg  weekly was prescribed -Patient tolerated Wegovy  0.25mg  well with no adverse side effects and did notice decreased appetite while taking.  She has completed 4 week sample of Wegovy  0.25mg , but insurance denied coverage of Wegovy  0.5mg ; so she has not been able to continue therapy -In need of bilateral knee replacement, but ortho will not operate until patient loses weight -Knee pain limits physical activity at this time -Patient has been diagnosed with OSA and was using CPAP for a while -Baseline BMI 47.56mg /m2; current weight 341lb  Objective:  Lab Results  Component Value Date   CREATININE 0.76 03/10/2023   BUN 14 03/10/2023   NA 137 03/10/2023   K 4.6 03/10/2023   CL 100 03/10/2023   CO2 25 03/10/2023   Medications Reviewed Today      Reviewed by Bethany Conley LABOR, [facility] (Pharmacist) on 11/10/23 at [phone]  Med List Status: <None>   Medication Order Taking? Sig Documenting Provider Last Dose Status Informant  ALPRAZolam (XANAX) 0.25 MG tablet [id number]  One tablet 3 times daily as needed for vertigo  Patient not taking: Reported on 11/10/2023   Bethany Ronal PARAS, MD  Active   diclofenac  Sodium (VOLTAREN ) 1 % GEL [id number]  Apply 2 g topically 4 (four) times daily.  Patient not taking: Reported on 11/10/2023   Bethany Speaks, FNP  Active   fluticasone  (FLONASE ) 50 MCG/ACT nasal spray [id number]  SPRAY 2 SPRAYS INTO EACH NOSTRIL EVERY DAY  Patient not taking: Reported on 11/10/2023   Bethany Speaks, FNP  Active   hydrochlorothiazide  (HYDRODIURIL ) 12.5 MG tablet [id number] Yes TAKE 1 TABLET BY MOUTH EVERY DAY Bethany Speaks, FNP  Active   Multiple Vitamins-Minerals (MULTIVIT/MULTIMINERAL ADULT) LIQD [id number]  Take by mouth daily. [provider]  Active Self  PARoxetine  (PAXIL ) 10 MG tablet [id number] Yes Take 1 tablet (10 mg total) by mouth daily. Bethany Speaks, FNP  Active   phentermine  37.5 MG capsule [id number]  Take 1 capsule (37.5 mg total) by mouth every morning.  Patient not taking: Reported on 11/10/2023   Bethany Speaks, FNP  Active   semaglutide -weight management (WEGOVY ) 0.5 MG/0.5ML SOAJ SQ injection [id number]  Inject 0.5 mg into the skin once a week.  Patient not taking: Reported on 11/10/2023   Bethany Speaks, FNP  Active            Med Note Bethany Conley   Mon Nov 03, 2023  4:10 PM) Last time she took it was on Wednesday  last week.    Patient not taking:   Discontinued 11/10/23 0812 (Completed Course)            Assessment/Plan:   Obesity/Overweight: -Currently unable to achieve goal weight loss of 5-10% through diet and lifestyle modifications alone -Submitted prior authorization request to insurance for Wegovy  0.5mg  weekly via CoverMyMeds (KEY:  [id number]).   -If PA for Wegovy  is denied, I will see if we  could get Zepbound covered based on OSA.   -Did discuss with patient that Wegovy  can be filled directly through [facility] for $499/month if we cannot get insurance approval  Follow Up Plan: Will monitor progress of Wegovy  PA and follow-up accordingly based on insurance determination  Bethany Conley, PharmD, DPLA

## 2023-11-12 ENCOUNTER — Telehealth: Payer: Self-pay

## 2023-11-12 NOTE — Progress Notes (Signed)
   11/12/2023  Patient ID: Bethany Conley, female   DOB: 09/18/1970, 53 y.o.   MRN: [id number]  Patient inquiring about taking a daily multivitamin with current medications, specifically the Wegovy  that was just resumed.  I advised patient there are no interactions or concerns with taking a daily multivitamin.  Patient also inquired about injection sites appropriate for Wegovy ; advised that dose should be administered I abdomen, thigh, top of the buttocks, or back of the arm where enough skin can be pinched and accessed to inject medication just below the skin.  Patient endorsed understanding.  Bethany Conley, PharmD, DPLA

## 2023-11-17 ENCOUNTER — Telehealth: Payer: Self-pay

## 2023-11-17 ENCOUNTER — Ambulatory Visit: Admitting: Nurse Practitioner

## 2023-11-17 NOTE — Progress Notes (Signed)
   11/17/2023  Patient ID: Bethany Conley, female   DOB: 1970-12-14, 53 y.o.   MRN: [id number]  Returning missed call/voicemail from patient stating Wegovy  pen did not correctly administer this week's 0.5mg  dose.  Patient went to give dose in thigh this morning and just felt the medication running down her leg.  Based on low dose she is on and the fact little if any medication was absorbed, I advised patient to redo dose with another pen.  [facility] will typically replace pens if there is a pen fault, so I am sending the patient the customer care number as well as information she should have on hand when she calls.  Bethany Conley, PharmD, DPLA

## 2023-11-18 ENCOUNTER — Other Ambulatory Visit: Payer: Self-pay | Admitting: Nurse Practitioner

## 2023-11-18 DIAGNOSIS — Z6841 Body Mass Index (BMI) 40.0 and over, adult: Secondary | ICD-10-CM

## 2023-11-24 ENCOUNTER — Telehealth: Payer: Self-pay

## 2023-11-24 DIAGNOSIS — E66813 Obesity, class 3: Secondary | ICD-10-CM

## 2023-11-24 NOTE — Progress Notes (Unsigned)
   11/24/2023  Patient ID: Bethany Conley, female   DOB: 11/10/1970, 53 y.o.   MRN: [id number]  Patient was able to receive a voucher from [facility]  Patient [facility] based on pen malfunction when she went to administer recent dose of Wegovy  0.5mg  weekly.  However, a new prescription needs to be sent to her pharmacy for another month supply of medication, as the original prescription did not have any refills.  Order pending for Bethany [name], FNP, to sign if in agreement.  Patient has a follow-up with provider again 11/3.  [name], PharmD, DPLA

## 2023-11-26 NOTE — Telephone Encounter (Signed)
 I am okay with the patient receiving the Wegovy  however if I send it in will we need to do another PA since the original was done by Dr. Perri

## 2023-11-27 ENCOUNTER — Telehealth: Payer: Self-pay

## 2023-11-27 MED ORDER — WEGOVY 0.5 MG/0.5ML ~~LOC~~ SOAJ: 0.5000 mg | SUBCUTANEOUS | 0 refills | Status: DC

## 2023-11-27 NOTE — Progress Notes (Signed)
   11/27/2023  Patient ID: Bethany Conley, female   DOB: Oct 07, 1970, 53 y.o.   MRN: [id number]  Returning missed call/voicemail from patient to let her know [name], FNP, sent refill for Wegovy  0.5 mg weekly to her pharmacy. This prescription was for a 1 month supply, which will get patient through until her follow-up visit next month.  Bethany Conley, PharmD, DPLA

## 2023-11-27 NOTE — Telephone Encounter (Signed)
 Okay I will see what happens, thanks

## 2023-12-08 ENCOUNTER — Encounter: Payer: Self-pay | Admitting: Nurse Practitioner

## 2023-12-08 ENCOUNTER — Ambulatory Visit (INDEPENDENT_AMBULATORY_CARE_PROVIDER_SITE_OTHER): Payer: Self-pay | Admitting: Nurse Practitioner

## 2023-12-08 VITALS — BP 110/70 | HR 91 | Temp 98.9°F | Ht 71.0 in | Wt 347.4 lb

## 2023-12-08 DIAGNOSIS — Z2821 Immunization not carried out because of patient refusal: Secondary | ICD-10-CM

## 2023-12-08 DIAGNOSIS — Z6841 Body Mass Index (BMI) 40.0 and over, adult: Secondary | ICD-10-CM | POA: Diagnosis not present

## 2023-12-08 DIAGNOSIS — E66813 Obesity, class 3: Secondary | ICD-10-CM

## 2023-12-08 DIAGNOSIS — M25572 Pain in left ankle and joints of left foot: Secondary | ICD-10-CM

## 2023-12-08 DIAGNOSIS — M25562 Pain in left knee: Secondary | ICD-10-CM | POA: Diagnosis not present

## 2023-12-08 MED ORDER — WEGOVY 1 MG/0.5ML ~~LOC~~ SOAJ: 1.0000 mg | SUBCUTANEOUS | 1 refills | Status: DC

## 2023-12-08 NOTE — Progress Notes (Signed)
 LILLETTE Kristeen JINNY Gladis, CMA,acting as a neurosurgeon for [name], FNP.,have documented all relevant documentation on the behalf of [name], FNP,as directed by  [name], FNP while in the presence of [name], FNP.  Subjective:  Patient ID: Bethany Conley , female    DOB: 04-21-1970 , 53 y.o.   MRN: [id number]  Chief Complaint  Patient presents with   Obesity    Patient presents today for a weight follow up, Patient reports compliance with medication. Patient denies any chest pain, SOB, or headaches. Patient has no concerns today.     HPI Discussed the use of AI scribe software for clinical note transcription with the patient, who gave verbal consent to proceed.  History of Present Illness Bethany Conley is a 53 year old female who presents for a weight check and management of her weight-related issues.  She is currently on Wegovy  0.5 mg for weight management, which she has been taking for a month without significant weight loss. She is not experiencing nausea with the medication. She previously stopped taking phentermine  and Xanax, which were prescribed for vertigo.  Her diet mainly consists of salads, water, chicken, and protein shakes. She typically has a protein shake for breakfast and eats lunch after 4 PM due to her busy work schedule. Lunch often includes grilled chicken nuggets and kale salad from [employer]. She sometimes skips dinner or has a light meal. She craves sweets and occasionally eats Talenti gelato.  She is trying to lose weight to qualify for knee surgery. She experiences knee pain, which worsens in the afternoon, and is taking an herbal supplement called Relax Day for pain and stiffness. She continues to take her blood pressure medication and medication for hot flashes.  She reports swelling in her left ankle, especially after standing for long periods. The swelling has been present for a long time, is not hard, and reduces in the morning.  She is on 0.5 mg  Wegovy  weekly, will plan to increase to 1mg . She does not see a difference in her appetite, continues to crave sweets.   Breakfast - protein shake  Lunch after 4pm - salad, chicken Dinner or in between - grilled chicken nuggets/kale salad       Past Medical History:  Diagnosis Date   Abnormal uterine bleeding (AUB)    Anemia    Arthritis    knees   Essential hypertension    Morbid obesity (HCC)    Uterine leiomyoma      Family History  Problem Relation Age of Onset   Cancer Mother    Diabetes Father    Breast cancer Neg Hx    BRCA 1/2 Neg Hx      Current Outpatient Medications:    hydrochlorothiazide  (HYDRODIURIL ) 12.5 MG tablet, TAKE 1 TABLET BY MOUTH EVERY DAY, Disp: 90 tablet, Rfl: 2   Multiple Vitamins-Minerals (MULTIVIT/MULTIMINERAL ADULT) LIQD, Take by mouth daily., Disp: , Rfl:    PARoxetine  (PAXIL ) 10 MG tablet, Take 1 tablet (10 mg total) by mouth daily., Disp: 90 tablet, Rfl: 1   semaglutide -weight management (WEGOVY ) 1 MG/0.5ML SOAJ SQ injection, Inject 1 mg into the skin once a week., Disp: 2 mL, Rfl: 1   ALPRAZolam (XANAX) 0.25 MG tablet, One tablet 3 times daily as needed for vertigo (Patient not taking: Reported on 12/08/2023), Disp: 30 tablet, Rfl: 0   diclofenac  Sodium (VOLTAREN ) 1 % GEL, Apply 2 g topically 4 (four) times daily. (Patient not taking: Reported on 12/08/2023), Disp: 100 g,  Rfl: 2   fluticasone  (FLONASE ) 50 MCG/ACT nasal spray, SPRAY 2 SPRAYS INTO EACH NOSTRIL EVERY DAY (Patient not taking: Reported on 12/08/2023), Disp: 48 mL, Rfl: 0   Allergies  Allergen Reactions   Sulfa Antibiotics Rash     Review of Systems  Constitutional: Negative.   Respiratory: Negative.    Cardiovascular: Negative.   Psychiatric/Behavioral: Negative.       Today's Vitals   12/08/23 1538  BP: 110/70  Pulse: 91  Temp: 98.9 F (37.2 C)  TempSrc: Oral  Weight: (!) 347 lb 6.4 oz (157.6 kg)  Height: 5' 11 (1.803 m)  PainSc: 6   PainLoc: Knee   Body mass  index is 48.45 kg/m.  Wt Readings from Last 3 Encounters:  12/08/23 (!) 347 lb 6.4 oz (157.6 kg)  11/03/23 (!) 341 lb (154.7 kg)  10/08/23 (!) 346 lb (156.9 kg)      Objective:  Physical Exam Vitals and nursing note reviewed.  Constitutional:      General: She is not in acute distress.    Appearance: Normal appearance. She is well-developed. She is obese.  HENT:     Head: Normocephalic and atraumatic.  Eyes:     Pupils: Pupils are equal, round, and reactive to light.  Cardiovascular:     Rate and Rhythm: Normal rate and regular rhythm.     Pulses: Normal pulses.     Heart sounds: Normal heart sounds. No murmur heard. Pulmonary:     Effort: Pulmonary effort is normal. No respiratory distress.     Breath sounds: Normal breath sounds. No wheezing.  Skin:    General: Skin is warm and dry.     Capillary Refill: Capillary refill takes less than 2 seconds.  Neurological:     General: No focal deficit present.     Mental Status: She is alert and oriented to person, place, and time.     Cranial Nerves: No cranial nerve deficit.     Motor: No weakness.  Psychiatric:        Mood and Affect: Mood normal.        Behavior: Behavior normal.        Thought Content: Thought content normal.        Judgment: Judgment normal.      Assessment And Plan:  Class 3 severe obesity due to excess calories without serious comorbidity with body mass index (BMI) of 45.0 to 49.9 in adult [facility]) Assessment & Plan: No significant weight loss on current semaglutide  dose. Diet includes protein shakes, grilled chicken, salads, and occasional sweets. Weight loss needed for knee surgery qualification. - Increased semaglutide  to 1 mg weekly. - Instructed to take two 0.5 mg doses if new prescription is unavailable. - Advised to contact pharmacy for new prescription availability. - Encouraged dietary modifications focusing on protein intake and reducing sweets.  Orders: -     Wegovy ; Inject 1 mg into the  skin once a week.  Dispense: 2 mL; Refill: 1  Influenza vaccination declined  Herpes zoster vaccination declined  Tetanus, diphtheria, and acellular pertussis (Tdap) vaccination declined  Arthralgia of left knee Assessment & Plan: Pain exacerbated by weight, worsens in the afternoon. Weight loss needed for knee surgery qualification.   Arthralgia of left ankle Assessment & Plan: Chronic swelling likely related to weight, blood pressure, or menopause. No acute injury or clot. - Recommended wearing compression socks. - Advised to monitor swelling, especially in the morning.     Return for keep same next.  Patient was given opportunity to ask questions. Patient verbalized understanding of the plan and was able to repeat key elements of the plan. All questions were answered to their satisfaction.   LILLETTE [name], FNP, have reviewed all documentation for this visit. The documentation on 12/08/23 for the exam, diagnosis, procedures, and orders are all accurate and complete.    IF YOU HAVE BEEN REFERRED TO A SPECIALIST, IT MAY TAKE 1-2 WEEKS TO SCHEDULE/PROCESS THE REFERRAL. IF YOU HAVE NOT HEARD FROM US /SPECIALIST IN TWO WEEKS, PLEASE GIVE US  A CALL AT [phone] X [phone].

## 2023-12-11 ENCOUNTER — Other Ambulatory Visit: Payer: Self-pay | Admitting: Nurse Practitioner

## 2023-12-11 DIAGNOSIS — E66813 Obesity, class 3: Secondary | ICD-10-CM

## 2023-12-14 NOTE — Assessment & Plan Note (Signed)
 Pain exacerbated by weight, worsens in the afternoon. Weight loss needed for knee surgery qualification.

## 2023-12-14 NOTE — Assessment & Plan Note (Signed)
 No significant weight loss on current semaglutide  dose. Diet includes protein shakes, grilled chicken, salads, and occasional sweets. Weight loss needed for knee surgery qualification. - Increased semaglutide  to 1 mg weekly. - Instructed to take two 0.5 mg doses if new prescription is unavailable. - Advised to contact pharmacy for new prescription availability. - Encouraged dietary modifications focusing on protein intake and reducing sweets.

## 2023-12-14 NOTE — Assessment & Plan Note (Signed)
 Chronic swelling likely related to weight, blood pressure, or menopause. No acute injury or clot. - Recommended wearing compression socks. - Advised to monitor swelling, especially in the morning.

## 2023-12-22 ENCOUNTER — Ambulatory Visit: Admitting: Nurse Practitioner

## 2023-12-26 ENCOUNTER — Encounter: Payer: Self-pay | Admitting: Nurse Practitioner

## 2023-12-29 ENCOUNTER — Other Ambulatory Visit: Payer: Self-pay

## 2023-12-29 DIAGNOSIS — E66813 Obesity, class 3: Secondary | ICD-10-CM

## 2023-12-29 MED ORDER — WEGOVY 1 MG/0.5ML ~~LOC~~ SOAJ: 1.0000 mg | SUBCUTANEOUS | 1 refills | Status: DC

## 2024-01-16 NOTE — Telephone Encounter (Signed)
 done

## 2024-01-27 ENCOUNTER — Other Ambulatory Visit: Payer: Self-pay

## 2024-01-27 DIAGNOSIS — Z6841 Body Mass Index (BMI) 40.0 and over, adult: Secondary | ICD-10-CM

## 2024-01-27 MED ORDER — WEGOVY 1 MG/0.5ML ~~LOC~~ SOAJ: 1.0000 mg | SUBCUTANEOUS | 1 refills | Status: AC

## 2024-03-10 ENCOUNTER — Encounter: Payer: Commercial Managed Care - PPO | Admitting: Nurse Practitioner

## 2024-03-15 ENCOUNTER — Encounter: Payer: Self-pay | Admitting: Nurse Practitioner
